# Patient Record
Sex: Female | Born: 1995 | Race: Black or African American | Hispanic: No | Marital: Single | Smoking: Never smoker
Health system: Southern US, Community
[De-identification: ages and names within clinical notes are randomized; demographics above are authoritative.]

## PROBLEM LIST (undated history)

## (undated) ENCOUNTER — Inpatient Hospital Stay (HOSPITAL_COMMUNITY): Payer: Self-pay

## (undated) ENCOUNTER — Emergency Department (HOSPITAL_COMMUNITY)

## (undated) DIAGNOSIS — R51 Headache: Secondary | ICD-10-CM

## (undated) DIAGNOSIS — R519 Headache, unspecified: Secondary | ICD-10-CM

## (undated) HISTORY — DX: Headache: R51

## (undated) HISTORY — PX: WISDOM TOOTH EXTRACTION: SHX21

## (undated) HISTORY — DX: Headache, unspecified: R51.9

---
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
## 2017-09-29 ENCOUNTER — Ambulatory Visit: Payer: BC Managed Care – PPO | Admitting: Family Medicine

## 2017-09-29 DIAGNOSIS — Z0289 Encounter for other administrative examinations: Secondary | ICD-10-CM

## 2017-11-25 ENCOUNTER — Encounter: Payer: Self-pay | Admitting: Family Medicine

## 2017-11-25 ENCOUNTER — Ambulatory Visit (INDEPENDENT_AMBULATORY_CARE_PROVIDER_SITE_OTHER): Payer: BC Managed Care – PPO | Admitting: Family Medicine

## 2017-11-25 VITALS — BP 100/70 | HR 80 | Temp 98.2°F | Ht 65.0 in | Wt 113.7 lb

## 2017-11-25 DIAGNOSIS — Z7689 Persons encountering health services in other specified circumstances: Secondary | ICD-10-CM

## 2017-11-25 DIAGNOSIS — Z23 Encounter for immunization: Secondary | ICD-10-CM | POA: Diagnosis not present

## 2017-11-25 DIAGNOSIS — G4700 Insomnia, unspecified: Secondary | ICD-10-CM | POA: Diagnosis not present

## 2017-11-25 DIAGNOSIS — F32A Depression, unspecified: Secondary | ICD-10-CM

## 2017-11-25 DIAGNOSIS — R634 Abnormal weight loss: Secondary | ICD-10-CM | POA: Diagnosis not present

## 2017-11-25 DIAGNOSIS — K219 Gastro-esophageal reflux disease without esophagitis: Secondary | ICD-10-CM | POA: Diagnosis not present

## 2017-11-25 DIAGNOSIS — F329 Major depressive disorder, single episode, unspecified: Secondary | ICD-10-CM

## 2017-11-25 LAB — CBC WITH DIFFERENTIAL/PLATELET
BASOS ABS: 0 10*3/uL (ref 0.0–0.1)
BASOS PCT: 0.3 % (ref 0.0–3.0)
EOS ABS: 0.1 10*3/uL (ref 0.0–0.7)
Eosinophils Relative: 2.2 % (ref 0.0–5.0)
HEMATOCRIT: 38 % (ref 36.0–49.0)
Hemoglobin: 12.6 g/dL (ref 12.0–16.0)
LYMPHS PCT: 26.5 % (ref 24.0–48.0)
Lymphs Abs: 1.8 10*3/uL (ref 0.7–4.0)
MCHC: 33.1 g/dL (ref 31.0–37.0)
MCV: 94.2 fl (ref 78.0–98.0)
MONO ABS: 0.5 10*3/uL (ref 0.1–1.0)
Monocytes Relative: 7.7 % (ref 3.0–12.0)
NEUTROS ABS: 4.3 10*3/uL (ref 1.4–7.7)
Neutrophils Relative %: 63.3 % (ref 43.0–71.0)
PLATELETS: 360 10*3/uL (ref 150.0–575.0)
RBC: 4.03 Mil/uL (ref 3.80–5.70)
RDW: 15.9 % — AB (ref 11.4–15.5)
WBC: 6.8 10*3/uL (ref 4.5–13.5)

## 2017-11-25 LAB — COMPREHENSIVE METABOLIC PANEL
ALK PHOS: 39 U/L — AB (ref 47–119)
ALT: 5 U/L (ref 0–35)
AST: 10 U/L (ref 0–37)
Albumin: 4.4 g/dL (ref 3.5–5.2)
BILIRUBIN TOTAL: 0.9 mg/dL (ref 0.2–1.2)
BUN: 9 mg/dL (ref 6–23)
CALCIUM: 9.1 mg/dL (ref 8.4–10.5)
CO2: 28 meq/L (ref 19–32)
CREATININE: 0.92 mg/dL (ref 0.40–1.20)
Chloride: 105 mEq/L (ref 96–112)
GFR: 100.42 mL/min (ref >60.00)
GLUCOSE: 85 mg/dL (ref 70–99)
Potassium: 3.7 mEq/L (ref 3.5–5.1)
Sodium: 141 mEq/L (ref 135–145)
TOTAL PROTEIN: 6.6 g/dL (ref 6.0–8.3)

## 2017-11-25 LAB — TSH: TSH: 3.6 u[IU]/mL (ref 0.40–5.00)

## 2017-11-25 LAB — LIPASE: Lipase: 12 U/L (ref 11.0–59.0)

## 2017-11-25 LAB — T4, FREE: Free T4: 0.85 ng/dL (ref 0.60–1.60)

## 2017-11-25 MED ORDER — OMEPRAZOLE 20 MG PO CPDR: 20.0000 mg | DELAYED_RELEASE_CAPSULE | Freq: Every day | ORAL | 3 refills | Status: DC

## 2017-11-25 NOTE — Patient Instructions (Addendum)
Gastroesophageal Reflux Disease, Adult Normally, food travels down the esophagus and stays in the stomach to be digested. If a person has gastroesophageal reflux disease (GERD), food and stomach acid move back up into the esophagus. When this happens, the esophagus becomes sore and swollen (inflamed). Over time, GERD can make small holes (ulcers) in the lining of the esophagus. Follow these instructions at home: Diet  Follow a diet as told by your doctor. You may need to avoid foods and drinks such as: ? Coffee and tea (with or without caffeine). ? Drinks that contain alcohol. ? Energy drinks and sports drinks. ? Carbonated drinks or sodas. ? Chocolate and cocoa. ? Peppermint and mint flavorings. ? Garlic and onions. ? Horseradish. ? Spicy and acidic foods, such as peppers, chili powder, curry powder, vinegar, hot sauces, and BBQ sauce. ? Citrus fruit juices and citrus fruits, such as oranges, lemons, and limes. ? Tomato-based foods, such as red sauce, chili, salsa, and pizza with red sauce. ? Fried and fatty foods, such as donuts, french fries, potato chips, and high-fat dressings. ? High-fat meats, such as hot dogs, rib eye steak, sausage, ham, and bacon. ? High-fat dairy items, such as whole milk, butter, and cream cheese.  Eat small meals often. Avoid eating large meals.  Avoid drinking large amounts of liquid with your meals.  Avoid eating meals during the 2-3 hours before bedtime.  Avoid lying down right after you eat.  Do not exercise right after you eat. General instructions  Pay attention to any changes in your symptoms.  Take over-the-counter and prescription medicines only as told by your doctor. Do not take aspirin, ibuprofen, or other NSAIDs unless your doctor says it is okay.  Do not use any tobacco products, including cigarettes, chewing tobacco, and e-cigarettes. If you need help quitting, ask your doctor.  Wear loose clothes. Do not wear anything tight around  your waist.  Raise (elevate) the head of your bed about 6 inches (15 cm).  Try to lower your stress. If you need help doing this, ask your doctor.  If you are overweight, lose an amount of weight that is healthy for you. Ask your doctor about a safe weight loss goal.  Keep all follow-up visits as told by your doctor. This is important. Contact a doctor if:  You have new symptoms.  You lose weight and you do not know why it is happening.  You have trouble swallowing, or it hurts to swallow.  You have wheezing or a cough that keeps happening.  Your symptoms do not get better with treatment.  You have a hoarse voice. Get help right away if:  You have pain in your arms, neck, jaw, teeth, or back.  You feel sweaty, dizzy, or light-headed.  You have chest pain or shortness of breath.  You throw up (vomit) and your throw up looks like blood or coffee grounds.  You pass out (faint).  Your poop (stool) is bloody or black.  You cannot swallow, drink, or eat. This information is not intended to replace advice given to you by your health care provider. Make sure you discuss any questions you have with your health care provider. Document Released: 04/26/2008 Document Revised: 04/15/2016 Document Reviewed: 03/05/2015 Elsevier Interactive Patient Education  2018 [employer]. Insomnia Insomnia is a sleep disorder that makes it difficult to fall asleep or to stay asleep. Insomnia can cause tiredness (fatigue), low energy, difficulty concentrating, mood swings, and poor performance at work or school. There are  three different ways to classify insomnia:  Difficulty falling asleep.  Difficulty staying asleep.  Waking up too early in the morning.  Any type of insomnia can be long-term (chronic) or short-term (acute). Both are common. Short-term insomnia usually lasts for three months or less. Chronic insomnia occurs at least three times a week for longer than three months. What are  the causes? Insomnia may be caused by another condition, situation, or substance, such as:  Anxiety.  Certain medicines.  Gastroesophageal reflux disease (GERD) or other gastrointestinal conditions.  Asthma or other breathing conditions.  Restless legs syndrome, sleep apnea, or other sleep disorders.  Chronic pain.  Menopause. This may include hot flashes.  Stroke.  Abuse of alcohol, tobacco, or illegal drugs.  Depression.  Caffeine.  Neurological disorders, such as Alzheimer disease.  An overactive thyroid (hyperthyroidism).  The cause of insomnia may not be known. What increases the risk? Risk factors for insomnia include:  Gender. Women are more commonly affected than men.  Age. Insomnia is more common as you get older.  Stress. This may involve your professional or personal life.  Income. Insomnia is more common in people with lower income.  Lack of exercise.  Irregular work schedule or night shifts.  Traveling between different time zones.  What are the signs or symptoms? If you have insomnia, trouble falling asleep or trouble staying asleep is the main symptom. This may lead to other symptoms, such as:  Feeling fatigued.  Feeling nervous about going to sleep.  Not feeling rested in the morning.  Having trouble concentrating.  Feeling irritable, anxious, or depressed.  How is this treated? Treatment for insomnia depends on the cause. If your insomnia is caused by an underlying condition, treatment will focus on addressing the condition. Treatment may also include:  Medicines to help you sleep.  Counseling or therapy.  Lifestyle adjustments.  Follow these instructions at home:  Take medicines only as directed by your health care provider.  Keep regular sleeping and waking hours. Avoid naps.  Keep a sleep diary to help you and your health care provider figure out what could be causing your insomnia. Include: ? When you sleep. ? When you  wake up during the night. ? How well you sleep. ? How rested you feel the next day. ? Any side effects of medicines you are taking. ? What you eat and drink.  Make your bedroom a comfortable place where it is easy to fall asleep: ? Put up shades or special blackout curtains to block light from outside. ? Use a white noise machine to block noise. ? Keep the temperature cool.  Exercise regularly as directed by your health care provider. Avoid exercising right before bedtime.  Use relaxation techniques to manage stress. Ask your health care provider to suggest some techniques that may work well for you. These may include: ? Breathing exercises. ? Routines to release muscle tension. ? Visualizing peaceful scenes.  Cut back on alcohol, caffeinated beverages, and cigarettes, especially close to bedtime. These can disrupt your sleep.  Do not overeat or eat spicy foods right before bedtime. This can lead to digestive discomfort that can make it hard for you to sleep.  Limit screen use before bedtime. This includes: ? Watching TV. ? Using your smartphone, tablet, and computer.  Stick to a routine. This can help you fall asleep faster. Try to do a quiet activity, brush your teeth, and go to bed at the same time each night.  Get out of  bed if you are still awake after 15 minutes of trying to sleep. Keep the lights down, but try reading or doing a quiet activity. When you feel sleepy, go back to bed.  Make sure that you drive carefully. Avoid driving if you feel very sleepy.  Keep all follow-up appointments as directed by your health care provider. This is important. Contact a health care provider if:  You are tired throughout the day or have trouble in your daily routine due to sleepiness.  You continue to have sleep problems or your sleep problems get worse. Get help right away if:  You have serious thoughts about hurting yourself or someone else. This information is not intended to  replace advice given to you by your health care provider. Make sure you discuss any questions you have with your health care provider. Document Released: 11/05/2000 Document Revised: 04/09/2016 Document Reviewed: 08/09/2014 Elsevier Interactive Patient Education  2018 [place] 18-39 Years, Female Preventive care refers to lifestyle choices and visits with your health care provider that can promote health and wellness. What does preventive care include?  A yearly physical exam. This is also called an annual well check.  Dental exams once or twice a year.  Routine eye exams. Ask your health care provider how often you should have your eyes checked.  Personal lifestyle choices, including: ? Daily care of your teeth and gums. ? Regular physical activity. ? Eating a healthy diet. ? Avoiding tobacco and drug use. ? Limiting alcohol use. ? Practicing safe sex. ? Taking vitamin and mineral supplements as recommended by your health care provider. What happens during an annual well check? The services and screenings done by your health care provider during your annual well check will depend on your age, overall health, lifestyle risk factors, and family history of disease. Counseling Your health care provider may ask you questions about your:  Alcohol use.  Tobacco use.  Drug use.  Emotional well-being.  Home and relationship well-being.  Sexual activity.  Eating habits.  Work and work Statistician.  Method of birth control.  Menstrual cycle.  Pregnancy history.  Screening You may have the following tests or measurements:  Height, weight, and BMI.  Diabetes screening. This is done by checking your blood sugar (glucose) after you have not eaten for a while (fasting).  Blood pressure.  Lipid and cholesterol levels. These may be checked every 5 years starting at age 25.  Skin check.  Hepatitis C blood test.  Hepatitis B blood test.  Sexually  transmitted disease (STD) testing.  BRCA-related cancer screening. This may be done if you have a family history of breast, ovarian, tubal, or peritoneal cancers.  Pelvic exam and Pap test. This may be done every 3 years starting at age 32. Starting at age 40, this may be done every 5 years if you have a Pap test in combination with an HPV test.  Discuss your test results, treatment options, and if necessary, the need for more tests with your health care provider. Vaccines Your health care provider may recommend certain vaccines, such as:  Influenza vaccine. This is recommended every year.  Tetanus, diphtheria, and acellular pertussis (Tdap, Td) vaccine. You may need a Td booster every 10 years.  Varicella vaccine. You may need this if you have not been vaccinated.  HPV vaccine. If you are 12 or younger, you may need three doses over 6 months.  Measles, mumps, and rubella (MMR) vaccine. You may need at least  one dose of MMR. You may also need a second dose.  Pneumococcal 13-valent conjugate (PCV13) vaccine. You may need this if you have certain conditions and were not previously vaccinated.  Pneumococcal polysaccharide (PPSV23) vaccine. You may need one or two doses if you smoke cigarettes or if you have certain conditions.  Meningococcal vaccine. One dose is recommended if you are age 72-21 years and a first-year college student living in a residence hall, or if you have one of several medical conditions. You may also need additional booster doses.  Hepatitis A vaccine. You may need this if you have certain conditions or if you travel or work in places where you may be exposed to hepatitis A.  Hepatitis B vaccine. You may need this if you have certain conditions or if you travel or work in places where you may be exposed to hepatitis B.  Haemophilus influenzae type b (Hib) vaccine. You may need this if you have certain risk factors.  Talk to your health care provider about which  screenings and vaccines you need and how often you need them. This information is not intended to replace advice given to you by your health care provider. Make sure you discuss any questions you have with your health care provider. Document Released: 01/04/2002 Document Revised: 07/28/2016 Document Reviewed: 09/09/2015 Elsevier Interactive Patient Education  [employer].

## 2017-11-25 NOTE — Progress Notes (Signed)
Patient presents to clinic today to establish care.  SUBJECTIVE: PMH: Pt is a 22 yo with pmh sig for migraines.  [facility] by Dr. Tama Highwiselton.  Insomnia: -Ongoing for the last 2-3 months -Patient states she may get 4 hours of sleep per night -Does endorse going to bed late and having to wake up early. -Patient has not tried anything for this  Weight loss: -Patient endorses losing weight without trying. -Pt has noticed this more after she stopped playing basketball -Patient does endorse decreased appetite.  She states after 1 bite of food she may not want to eating more or after warming up food she has no appetite. -Pt states her decreased appetite comes and goes.  Has been going on for "a while". -Patient endorses h/o GERD/reflux.   -Reflux worse near menses.  Will take Pepto bismal.  Allergies: NKDA  Past surgical history: Wisdom teeth extraction  Social history: Patient is single.  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  Patient denies tobacco use.  Patient endorses social alcohol use and daily marijuana use.  LMP November 18, 2017.  Family medical history: Mom-alive Dad-alive, asthma Sister-Shaniece, alive Brother-alive Brother-Jaden, asthma  Health Maintenance: [facility] [employer] [place] Immunizations --patient sent influenza vaccine today PAP --n/a      Past Medical History:  Diagnosis Date  . Headache     Past Surgical History:  Procedure Laterality Date  . WISDOM TOOTH EXTRACTION      No current outpatient medications on file prior to visit.   No current facility-administered medications on file prior to visit.     No Known Allergies  Family History  Problem Relation Age of Onset  . High blood pressure Maternal Grandmother        Died at 8453  . Alzheimer's disease Maternal Grandfather        Died at 8483  .  Cancer Paternal Grandfather        Age at time of death unknown    Social History   Socioeconomic History  . Marital status: Single    Spouse name: Not on file  . Number of children: Not on file  . Years of education: Not on file  . Highest education level: Not on file  Social Needs  . Financial resource strain: Not on file  . Food insecurity - worry: Not on file  . Food insecurity - inability: Not on file  . Transportation needs - medical: Not on file  . Transportation needs - non-medical: Not on file  Occupational History  . Not on file  Tobacco Use  . Smoking status: Never Smoker  . Smokeless tobacco: Never Used  Substance and Sexual Activity  . Alcohol use: No    Alcohol/week: 0.0 oz  . Drug use: Yes    Types: Marijuana  . Sexual activity: No  Other Topics Concern  . Not on file  Social History Narrative  . Not on file    ROS General: Denies fever, chills, night sweats, changes in weight, changes in appetite  + insomnia, decreased appetite, weight loss HEENT: Denies headaches, ear pain, changes in vision, rhinorrhea, sore throat CV: Denies CP, palpitations, SOB, orthopnea Pulm: Denies SOB, cough, wheezing GI: Denies abdominal pain, nausea, vomiting, diarrhea, constipation    + reflux GU: Denies dysuria, hematuria, frequency, vaginal discharge Msk: Denies muscle cramps, joint pains Neuro: Denies weakness, numbness, tingling Skin: Denies rashes,  bruising Psych: Denies anxiety, hallucinations   + depression  BP 100/70 (BP Location: Right Arm, Patient Position: Sitting, Cuff Size: Normal)   Pulse 80   Temp 98.2 F (36.8 C) (Oral)   Ht 5\' 5"  (1.651 m)   Wt 113 lb 11.2 oz (51.6 kg)   BMI 18.92 kg/m   Physical Exam Gen. Pleasant, well developed, well-nourished, in NAD HEENT - [place]/AT, PERRL, EOMI, conjunctive clear, no scleral icterus, no nasal drainage, pharynx without erythema or exudate. Neck: No JVD, no thyromegaly Lungs: no use of accessory muscles, CTAB,  no wheezes, rales or rhonchi Cardiovascular: RRR, No r/g/m, no peripheral edema Abdomen: BS present, soft, nontender,nondistended, no hepatosplenomegaly Musculoskeletal: No deformities, moves all four extremities, no cyanosis or clubbing, normal tone Neuro:  A&Ox3, CN II-XII intact, normal gait Skin:  Warm, dry, intact, no lesions Psych: normal affect, mood appropriate  No results found for this or any previous visit (from the past 2160 hour(s)).  Assessment/Plan: Gastroesophageal reflux disease, esophagitis presence not specified  - Plan: omeprazole (PRILOSEC) 20 MG capsule, Comprehensive metabolic panel, Lipase  Need for immunization against influenza  - Plan: Flu Vaccine QUAD 36+ mos IM  Insomnia, unspecified type  -Discussed sleep hygiene -Discussed limiting alcohol intake and marijuana use - Plan: TSH, T4, free  Weight loss  -Discussed limiting alcohol intake and marijuana use - Plan: TSH, T4, free, Comprehensive metabolic panel, Lipase, CBC with Differential/Platelet, CBC with Differential/Platelet  Depression, unspecified depression type -PHQ 9 score 14, moderate -Mention counseling.  Patient contemplating this -Patient does not wish to try medication at this time. -We will have patient follow-up in 1 month to reevaluate.  Sooner if needed  Encounter to establish care -We reviewed the PMH, PSH, FH, SH, Meds and Allergies. -We provided refills for any medications we will prescribe as needed. -We addressed current concerns per orders and patient instructions. -We have asked for records for pertinent exams, studies, vaccines and notes from previous providers. -We have advised patient to follow up per instructions below.    Abbe Amsterdam, MD

## 2018-01-13 ENCOUNTER — Ambulatory Visit: Payer: BC Managed Care – PPO | Admitting: Family Medicine

## 2018-01-13 ENCOUNTER — Encounter: Payer: Self-pay | Admitting: Family Medicine

## 2018-01-13 VITALS — BP 110/76 | HR 64 | Temp 98.3°F | Wt 109.0 lb

## 2018-01-13 DIAGNOSIS — R634 Abnormal weight loss: Secondary | ICD-10-CM | POA: Diagnosis not present

## 2018-01-13 DIAGNOSIS — F458 Other somatoform disorders: Secondary | ICD-10-CM

## 2018-01-13 DIAGNOSIS — R0989 Other specified symptoms and signs involving the circulatory and respiratory systems: Secondary | ICD-10-CM

## 2018-01-13 MED ORDER — OMEPRAZOLE 20 MG PO CPDR: 20.0000 mg | DELAYED_RELEASE_CAPSULE | Freq: Two times a day (BID) | ORAL | 3 refills | Status: DC

## 2018-01-13 NOTE — Patient Instructions (Addendum)
Globus Pharyngeus Globus pharyngeus is a condition that makes it feel like you have a lump in your throat. It may also feel like you have something stuck in the front of your throat. This feeling may come and go. It is not painful, and it does not make it harder to swallow food or liquid. Globus pharyngeus does not cause changes that a health care provider can see during a physical exam. This condition usually goes away without treatment. What are the causes? Often, no cause can be found. The most common cause of globus pharyngeus is a condition that causes stomach juices to flow back up into the throat (gastroesophageal reflux). Other possible causes include:  Overstimulation of nerves that control swallowing.  Irritation of nerves that control swallowing (neuralgia).  An enlarged gland in the lower neck (thyroid gland).  Growth of tonsil tissue at the base of the tongue (lingual tonsil).  Anxiety.  Depression.  What are the signs or symptoms? The main symptom of this condition is a feeling of a lump in your throat. This feeling usually comes and goes. How is this diagnosed? This condition may be diagnosed after other conditions have been ruled out. You may have tests, such as:  A swallow study.  Ear, nose, and throat evaluation.  An exam of your throat using a thin, flexible tube with a light and camera on the end (endoscopy).  How is this treated? This condition may go away on its own, without treatment. In some cases, antidepressant medicines may be helpful. Follow these instructions at home:  Follow instructions from your health care provider about eating or drinking restrictions.  Take over-the-counter and prescription medicines only as told by your health care provider.  Keep all follow-up visits as told by your health care provider. This is important.  Follow instructions from your health care provider about home care for gastroesophageal reflux. Your health care  provider may recommend that you: ? Do not eat or drink anything that causes heartburn. ? Do not eat heavy meals close to bedtime. ? Do not drink caffeine. ? Do not drink alcohol. ? Raise the head of your bed. ? Sleep on your left side. Contact a health care provider if:  Your symptoms get worse.  You have throat pain.  You have trouble swallowing.  Food or liquid comes back up into your mouth.  You lose weight without trying. Get help right away if:  You develop swelling in your throat. Summary  Globus pharyngeus is a condition that makes it feel like you have a lump in your throat.  This condition usually goes away without treatment. This information is not intended to replace advice given to you by your health care provider. Make sure you discuss any questions you have with your health care provider. Document Released: 07/14/2016 Document Revised: 07/14/2016 Document Reviewed: 07/14/2016 Elsevier Interactive Patient Education  2018 Elsevier Inc.  

## 2018-01-13 NOTE — Progress Notes (Signed)
Subjective:    Patient ID: Elaine Montgomery, female    DOB: 04/09/1996, 22 y.o.   MRN: [id number]  No chief complaint on file. Patient is accompanied by her mother.  HPI Patient was seen today for follow-up on ongoing concern.  Pt continues to endorse difficulty eating foods, hot flashes, weight loss.  Pt states she feels like food is getting stuck in her throat.  Pt continues to have decreased appetite.  She may eat 1 bite of food and then not be able to eat anything else.  Pt unable to describe fully the feeling, states not really sure if she is having pain or if the feeling is just 2/2 being hungry.  Pt states she may wake up from sleep feeling like food is stuck in her.  Pt also endorses approximately 30 pound weight loss over the last 6 months.  Since last OFV on 11/25/17 pt has lost 4lbs.  Pt has some improvement when taking omeprazole 20 mg.  Pt also endorses loose stools.  Labs ordered at last OFV were normal.  They included CMP, CBC, TSH, free T4.  Past Medical History:  Diagnosis Date  . Headache     No Known Allergies  ROS General: Denies fever, chills, night sweats +changes in weight, changes in appetite   HEENT: Denies headaches, ear pain, changes in vision, rhinorrhea, sore throat CV: Denies CP, palpitations, SOB, orthopnea Pulm: Denies SOB, cough, wheezing GI: Denies abdominal pain, nausea, vomiting, diarrhea, constipation   +globus pharyngeus  GU: Denies dysuria, hematuria, frequency, vaginal discharge Msk: Denies muscle cramps, joint pains Neuro: Denies weakness, numbness, tingling Skin: Denies rashes, bruising Psych: Denies depression, anxiety, hallucinations     Objective:    Blood pressure 110/76, pulse 64, temperature 98.3 F (36.8 C), temperature source Oral, weight 109 lb (49.4 kg), last menstrual period 01/13/2018, SpO2 98 %.   Gen. Pleasant, well-nourished, in no distress, normal affect   HEENT: [place]/AT, face symmetric, no scleral icterus, PERRLA, nares  patent without drainage, pharynx without erythema or exudate Lungs: no accessory muscle use, CTAB, no wheezes or rales Cardiovascular: RRR, no m/r/g, no peripheral edema Abdomen: BS present, soft, ND, mild TTP of LLQ, no hepatosplenomegaly. Neuro:  A&Ox3, CN II-XII intact, normal gait    Wt Readings from Last 3 Encounters:  01/13/18 109 lb (49.4 kg) (13 %, Z= -1.10)*  11/25/17 113 lb 11.2 oz (51.6 kg) (22 %, Z= -0.78)*  11/12/14 123 lb (55.8 kg) (55 %, Z= 0.11)*   * Growth percentiles are based on CDC (Girls, 2-20 Years) data.    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  Consider lipase as well. -given handout -Will refer to GI, will likely need EGD.  GI appt Monday 25th at 2:15 pm [facility] - Plan: omeprazole (PRILOSEC) 20 MG capsule, Ambulatory referral to Gastroenterology  Weight loss  -likely 2/2 decreased appetite/inability to eat. - Plan: Ambulatory referral to Gastroenterology  F/u prn   Abbe AmsterdamShannon Aleighna Wojtas, MD.

## 2018-01-16 ENCOUNTER — Encounter: Payer: Self-pay | Admitting: Physician Assistant

## 2018-01-16 ENCOUNTER — Ambulatory Visit: Payer: BC Managed Care – PPO | Admitting: Physician Assistant

## 2018-01-16 VITALS — BP 112/58 | HR 68 | Ht 64.75 in | Wt 107.6 lb

## 2018-01-16 DIAGNOSIS — R110 Nausea: Secondary | ICD-10-CM

## 2018-01-16 DIAGNOSIS — F458 Other somatoform disorders: Secondary | ICD-10-CM

## 2018-01-16 DIAGNOSIS — R0989 Other specified symptoms and signs involving the circulatory and respiratory systems: Secondary | ICD-10-CM

## 2018-01-16 DIAGNOSIS — R1013 Epigastric pain: Secondary | ICD-10-CM

## 2018-01-16 DIAGNOSIS — R634 Abnormal weight loss: Secondary | ICD-10-CM | POA: Diagnosis not present

## 2018-01-16 NOTE — Patient Instructions (Addendum)
You have been scheduled for an endoscopy. Please follow written instructions given to you at your visit today. If you use inhalers (even only as needed), please bring them with you on the day of your procedure. Your physician has requested that you go to www.startemmi.com and enter the access code given to you at your visit today. This web site gives a general overview about your procedure. However, you should still follow specific instructions given to you by our office regarding your preparation for the procedure.  Continue Omeprazole 20 mg twice a day

## 2018-01-16 NOTE — Progress Notes (Signed)
Assessment and plans reviewed  

## 2018-01-16 NOTE — Progress Notes (Signed)
Chief Complaint: Weight Loss  HPI:    [name] is a 22 year old African-American female, who was referred to me by Deeann Saint, MD for a complaint of weight loss.      Per review of chart patient has lost 6 pounds per our records since January of this year, from 113-107.  Last documented weight before this was 123 and 2015.    Patient saw PCP 01/13/18 and endorsed difficulty with eating foods as well as hot flashes and weight loss.  She felt as though food was getting stuck in her throat and continued with a decreased appetite.  She reported 30 pound weight loss over the past 6 months.  Apparently some improvement with omeprazole 20 mg daily.  Omeprazole was increased to 20 mg twice daily.  Recent labs 11/25/17 CBC, lipase CMP and TSH were normal.    Today, accompanied by mom who does assist with history.  About 6 months ago she quit playing basketball and since then she has lost 30 pounds per her scales at home. Has a feeling of nausea and food aversion with no vomiting.  Also with some epigastric/left upper quadrant abdominal pain and a softer stool than normal, decrease in appetite and early satiety.  Omeprazole 20 mg once daily did help a little and this was increased to twice daily last week and the patient was able to eat a full meal yesterday.  Explains that she gets an appetite late at night.  Does admit to daily marijuana use.  Hot showers do not help with nausea.    Denies fever, chills, blood in her school, melena or symptoms that awaken her at night.  Past Medical History:  Diagnosis Date  . Headache     Past Surgical History:  Procedure Laterality Date  . WISDOM TOOTH EXTRACTION      Current Outpatient Medications  Medication Sig Dispense Refill  . naproxen (NAPROSYN) 500 MG tablet Take 500 mg by mouth 2 (two) times daily with a meal.    . omeprazole (PRILOSEC) 20 MG capsule Take 1 capsule (20 mg total) by mouth 2 (two) times daily before a meal. 60 capsule 3   No current  facility-administered medications for this visit.     Allergies as of 01/16/2018  . (No Known Allergies)    Family History  Problem Relation Age of Onset  . High blood pressure Maternal Grandmother        Died at 85  . Alzheimer's disease Maternal Grandfather        Died at 10  . Cancer Paternal Grandfather        Age at time of death unknown    Social History   Socioeconomic History  . Marital status: Single    Spouse name: Not on file  . Number of children: Not on file  . Years of education: Not on file  . Highest education level: Not on file  Social Needs  . Financial resource strain: Not on file  . Food insecurity - worry: Not on file  . Food insecurity - inability: Not on file  . Transportation needs - medical: Not on file  . Transportation needs - non-medical: Not on file  Occupational History  . Not on file  Tobacco Use  . Smoking status: Never Smoker  . Smokeless tobacco: Never Used  Substance and Sexual Activity  . Alcohol use: No    Alcohol/week: 0.0 oz  . Drug use: Yes    Types: Marijuana  .  Sexual activity: No  Other Topics Concern  . Not on file  Social History Narrative  . Not on file    Review of Systems:    Constitutional: No fever or chills Skin: No rash  Cardiovascular: No chest pain Respiratory: No SOB  Gastrointestinal: See HPI and otherwise negative Genitourinary: No dysuria Neurological: No headache, dizziness or syncope Musculoskeletal: No new muscle or joint pain Hematologic: No bleeding Psychiatric: No history of depression or anxiety   Physical Exam:  Vital signs: Ht 5' 4.75" (1.645 m)   Wt 107 lb 9.6 oz (48.8 kg)   LMP 01/13/2018 (Exact Date)   BMI 18.04 kg/m   Constitutional:   Pleasant thin appearing AA female appears to be in NAD, Well developed, Well nourished, alert and cooperative Head:  Normocephalic and atraumatic. Eyes:   PEERL, EOMI. No icterus. Conjunctiva pink. Ears:  Normal auditory acuity. Neck:   Supple Throat: Oral cavity and pharynx without inflammation, swelling or lesion.  Respiratory: Respirations even and unlabored. Lungs clear to auscultation bilaterally.   No wheezes, crackles, or rhonchi.  Cardiovascular: Normal S1, S2. No MRG. Regular rate and rhythm. No peripheral edema, cyanosis or pallor.  Gastrointestinal:  Soft, nondistended, mild LUQ ttp, No rebound or guarding. Normal bowel sounds. No appreciable masses or hepatomegaly. Rectal:  Not performed.  Msk:  Symmetrical without gross deformities. Without edema, no deformity or joint abnormality.  Neurologic:  Alert and  oriented x4;  grossly normal neurologically.  Skin:   Dry and intact without significant lesions or rashes. Psychiatric:  Demonstrates good judgement and reason without abnormal affect or behaviors.  RELEVANT LABS AND IMAGING: CBC    Component Value Date/Time   WBC 6.8 11/25/2017 0918   RBC 4.03 11/25/2017 0918   HGB 12.6 11/25/2017 0918   HCT 38.0 11/25/2017 0918   PLT 360.0 11/25/2017 0918   MCV 94.2 11/25/2017 0918   MCHC 33.1 11/25/2017 0918   RDW 15.9 (H) 11/25/2017 0918   LYMPHSABS 1.8 11/25/2017 0918   MONOABS 0.5 11/25/2017 0918   EOSABS 0.1 11/25/2017 0918   BASOSABS 0.0 11/25/2017 0918    CMP     Component Value Date/Time   NA 141 11/25/2017 0918   K 3.7 11/25/2017 0918   CL 105 11/25/2017 0918   CO2 28 11/25/2017 0918   GLUCOSE 85 11/25/2017 0918   BUN 9 11/25/2017 0918   CREATININE 0.92 11/25/2017 0918   CALCIUM 9.1 11/25/2017 0918   PROT 6.6 11/25/2017 0918   ALBUMIN 4.4 11/25/2017 0918   AST 10 11/25/2017 0918   ALT 5 11/25/2017 0918   ALKPHOS 39 (L) 11/25/2017 0918   BILITOT 0.9 11/25/2017 0918    Assessment: 1. Globus sensation: Occurs in the morning after having something to eat late at night; consider esophagitis versus GERD 2. Decreased appetite: Consider relation to nausea and early satiety; must consider relation to marijuana use 3. Weight loss: 30 pounds per  patient report in the past 6 months; consider relation to nausea above vs celiac disease vs h.pylori vs IBS/functional dyspepsia 4.  Abdominal pain  Plan: 1.  Scheduled patient for an EGD with [facility]Perry [facility].  Did discuss risk, benefits, limitations and alternatives and the patient agrees to proceed. 2.  Continue Omeprazole 20 mg twice daily, 30-60 minutes before breakfast and dinner. 3.  Recommend the patient discontinue her marijuana use. 4.  Recommend supplementing diet with boost/Ensure shakes 5.  Patient to await further recommendations after EGD.  Hyacinth MeekerJennifer Fontella Shan, PA-C [facility] 01/16/2018, 2:09 PM  Cc: Deeann Saint, MD

## 2018-01-17 ENCOUNTER — Ambulatory Visit (AMBULATORY_SURGERY_CENTER): Payer: BC Managed Care – PPO | Admitting: Internal Medicine

## 2018-01-17 ENCOUNTER — Encounter: Payer: Self-pay | Admitting: Internal Medicine

## 2018-01-17 ENCOUNTER — Other Ambulatory Visit: Payer: Self-pay

## 2018-01-17 VITALS — BP 128/86 | HR 54 | Temp 98.6°F | Resp 13 | Ht 64.0 in | Wt 107.0 lb

## 2018-01-17 DIAGNOSIS — R1013 Epigastric pain: Secondary | ICD-10-CM | POA: Diagnosis not present

## 2018-01-17 DIAGNOSIS — R634 Abnormal weight loss: Secondary | ICD-10-CM

## 2018-01-17 DIAGNOSIS — R110 Nausea: Secondary | ICD-10-CM

## 2018-01-17 MED ORDER — SODIUM CHLORIDE 0.9 % IV SOLN: 500.0000 mL | Freq: Once | INTRAVENOUS | Status: DC

## 2018-01-17 NOTE — Progress Notes (Signed)
Spontaneous respirations throughout. VSS. Resting comfortably. To PACU on room air. Report to  RN. 

## 2018-01-17 NOTE — Progress Notes (Signed)
Pt states that there is no possibility that she could be pregnant. Declines pregnancy test at this time.

## 2018-01-17 NOTE — Op Note (Signed)
[facility] Patient Name: Elaine Montgomery Procedure Date: 01/17/2018 3:16 PM MRN: [id number] Endoscopist: Wilhemina Bonito. Marina Goodell , MD Age: 22 Referring MD:  Date of Birth: [birth year] Gender: Female Account #: [id number] Procedure:                Upper GI endoscopy Indications:              Anorexia, Abdominal bloating, Weight loss Medicines:                Monitored Anesthesia Care Procedure:                Pre-Anesthesia Assessment:                           - Prior to the procedure, a History and Physical                            was performed, and patient medications and                            allergies were reviewed. The patient's tolerance of                            previous anesthesia was also reviewed. The risks                            and benefits of the procedure and the sedation                            options and risks were discussed with the patient.                            All questions were answered, and informed consent                            was obtained. Prior Anticoagulants: The patient has                            taken no previous anticoagulant or antiplatelet                            agents. ASA Grade Assessment: I - A normal, healthy                            patient. After reviewing the risks and benefits,                            the patient was deemed in satisfactory condition to                            undergo the procedure.                           After obtaining informed consent, the endoscope was  passed under direct vision. Throughout the                            procedure, the patient's blood pressure, pulse, and                            oxygen saturations were monitored continuously. The                            Model GIF-HQ190 [id number]) scope was introduced                            through the mouth, and advanced to the second part                            of duodenum. The upper GI  endoscopy was                            accomplished without difficulty. The patient                            tolerated the procedure well. Scope In: Scope Out: Findings:                 The esophagus was normal.                           The stomach was normal.                           The examined duodenum was normal.                           The cardia and gastric fundus were normal on                            retroflexion. Complications:            No immediate complications. Estimated Blood Loss:     Estimated blood loss: none. Impression:               - Normal esophagus.                           - Normal stomach.                           - Normal examined duodenum.                           - No specimens collected. Recommendation:           - Patient has a contact number available for                            emergencies. The signs and symptoms of potential  delayed complications were discussed with the                            patient. Return to normal activities tomorrow.                            Written discharge instructions were provided to the                            patient.                           - Resume previous diet.                           - Continue present medications.                           - Schedule solid-phase gastric emptying scan "rule                            out gastroparesis". We will contact you with the                            results and further recommendations, if indicated,                            when available. Wilhemina BonitoJohn N. Marina GoodellPerry, MD 01/17/2018 3:29:13 PM This report has been signed electronically.

## 2018-01-17 NOTE — Patient Instructions (Signed)
DR. Lamar SprinklesPERRY'S OFFICE WILL SCHEDULE GASTRIC EMPTYING SCAN..  YOU HAD AN ENDOSCOPIC PROCEDURE TODAY AT THE [facility]:   Refer to the procedure report that was given to you for any specific questions about what was found during the examination.  If the procedure report does not answer your questions, please call your gastroenterologist to clarify.  If you requested that your care partner not be given the details of your procedure findings, then the procedure report has been included in a sealed envelope for you to review at your convenience later.  YOU SHOULD EXPECT: Some feelings of bloating in the abdomen. Passage of more gas than usual.  Walking can help get rid of the air that was put into your GI tract during the procedure and reduce the bloating. If you had a lower endoscopy (such as a colonoscopy or flexible sigmoidoscopy) you may notice spotting of blood in your stool or on the toilet paper. If you underwent a bowel prep for your procedure, you may not have a normal bowel movement for a few days.  Please Note:  You might notice some irritation and congestion in your nose or some drainage.  This is from the oxygen used during your procedure.  There is no need for concern and it should clear up in a day or so.  SYMPTOMS TO REPORT IMMEDIATELY:    Following upper endoscopy (EGD)  Vomiting of blood or coffee ground material  New chest pain or pain under the shoulder blades  Painful or persistently difficult swallowing  New shortness of breath  Fever of 100F or higher  Black, tarry-looking stools  For urgent or emergent issues, a gastroenterologist can be reached at any hour by calling [phone].   DIET:  We do recommend a small meal at first, but then you may proceed to your regular diet.  Drink plenty of fluids but you should avoid alcoholic beverages for 24 hours.  ACTIVITY:  You should plan to take it easy for the rest of today and you should NOT DRIVE or use heavy  machinery until tomorrow (because of the sedation medicines used during the test).    FOLLOW UP: Our staff will call the number listed on your records the next business day following your procedure to check on you and address any questions or concerns that you may have regarding the information given to you following your procedure. If we do not reach you, we will leave a message.  However, if you are feeling well and you are not experiencing any problems, there is no need to return our call.  We will assume that you have returned to your regular daily activities without incident.  If any biopsies were taken you will be contacted by phone or by letter within the next 1-3 weeks.  Please call us at [phone] if you have not heard about the biopsies in 3 weeks.    SIGNATURES/CONFIDENTIALITY: You and/or your care partner have signed paperwork which will be entered into your electronic medical record.  These signatures attest to the fact that that the information above on your After Visit Summary has been reviewed and is understood.  Full responsibility of the confidentiality of this discharge information lies with you and/or your care-partner.

## 2018-01-17 NOTE — Progress Notes (Signed)
Pt's states no medical or surgical changes since previsit or office visit. 

## 2018-01-18 ENCOUNTER — Telehealth: Payer: Self-pay

## 2018-01-18 ENCOUNTER — Other Ambulatory Visit: Payer: Self-pay

## 2018-01-18 DIAGNOSIS — R1084 Generalized abdominal pain: Secondary | ICD-10-CM

## 2018-01-18 NOTE — Telephone Encounter (Signed)
[facility]30am, pt to arrive there at 7:15am. Pt to be NPO after midnight and hold prilosec for 8 hours prior to exam. Pt knows this is a 4 hour test.

## 2018-01-18 NOTE — Telephone Encounter (Signed)
  Follow up Call-  Call Elaine Montgomery number 01/17/2018  Post procedure Call Elaine Montgomery phone  # [phone]  Permission to leave phone message Yes  Some recent data might be hidden     Patient questions:  Do you have a fever, pain , or abdominal swelling? No. Pain Score  0 *  Have you tolerated food without any problems? Yes.    Have you been able to return to your normal activities? Yes.    Do you have any questions about your discharge instructions: Diet   No. Medications  No. Follow up visit  No.  Do you have questions or concerns about your Care? No.  Actions: * If pain score is 4 or above: No action needed, pain <4.

## 2018-01-19 ENCOUNTER — Telehealth: Payer: Self-pay | Admitting: Internal Medicine

## 2018-01-19 NOTE — Telephone Encounter (Signed)
Letter faxed per pt request.

## 2018-01-31 ENCOUNTER — Encounter (HOSPITAL_COMMUNITY)
Admission: RE | Admit: 2018-01-31 | Discharge: 2018-01-31 | Disposition: A | Discharge status: Home / Self Care | Payer: BC Managed Care – PPO | Source: Ambulatory Visit | Attending: Internal Medicine | Admitting: Internal Medicine

## 2018-01-31 DIAGNOSIS — R1084 Generalized abdominal pain: Secondary | ICD-10-CM | POA: Diagnosis not present

## 2018-01-31 MED ORDER — TECHNETIUM TC 99M SULFUR COLLOID
1.9000 | Freq: Once | INTRAVENOUS | Status: AC | PRN
  Administered 2018-01-31: 1.9 via INTRAVENOUS

## 2018-03-14 ENCOUNTER — Ambulatory Visit: Payer: BC Managed Care – PPO | Admitting: Physician Assistant

## 2018-04-28 ENCOUNTER — Ambulatory Visit: Payer: BC Managed Care – PPO | Admitting: Family Medicine

## 2018-04-28 ENCOUNTER — Encounter: Payer: Self-pay | Admitting: Family Medicine

## 2018-04-28 VITALS — BP 100/62 | HR 84 | Temp 98.3°F | Wt 113.0 lb

## 2018-04-28 DIAGNOSIS — H6121 Impacted cerumen, right ear: Secondary | ICD-10-CM | POA: Diagnosis not present

## 2018-04-28 NOTE — Patient Instructions (Signed)
Earwax Buildup, Adult The ears produce a substance called earwax that helps keep bacteria out of the ear and protects the skin in the ear canal. Occasionally, earwax can build up in the ear and cause discomfort or hearing loss. What increases the risk? This condition is more likely to develop in people who:  Are female.  Are elderly.  Naturally produce more earwax.  Clean their ears often with cotton swabs.  Use earplugs often.  Use in-ear headphones often.  Wear hearing aids.  Have narrow ear canals.  Have earwax that is overly thick or sticky.  Have eczema.  Are dehydrated.  Have excess hair in the ear canal.  What are the signs or symptoms? Symptoms of this condition include:  Reduced or muffled hearing.  A feeling of fullness in the ear or feeling that the ear is plugged.  Fluid coming from the ear.  Ear pain.  Ear itch.  Ringing in the ear.  Coughing.  An obvious piece of earwax that can be seen inside the ear canal.  How is this diagnosed? This condition may be diagnosed based on:  Your symptoms.  Your medical history.  An ear exam. During the exam, your health care provider will look into your ear with an instrument called an otoscope.  You may have tests, including a hearing test. How is this treated? This condition may be treated by:  Using ear drops to soften the earwax.  Having the earwax removed by a health care provider. The health care provider may: ? Flush the ear with water. ? Use an instrument that has a loop on the end (curette). ? Use a suction device.  Surgery to remove the wax buildup. This may be done in severe cases.  Follow these instructions at home:  Take over-the-counter and prescription medicines only as told by your health care provider.  Do not put any objects, including cotton swabs, into your ear. You can clean the opening of your ear canal with a washcloth or facial tissue.  Follow instructions from your health  care provider about cleaning your ears. Do not over-clean your ears.  Drink enough fluid to keep your urine clear or pale yellow. This will help to thin the earwax.  Keep all follow-up visits as told by your health care provider. If earwax builds up in your ears often or if you use hearing aids, consider seeing your health care provider for routine, preventive ear cleanings. Ask your health care provider how often you should schedule your cleanings.  If you have hearing aids, clean them according to instructions from the manufacturer and your health care provider. Contact a health care provider if:  You have ear pain.  You develop a fever.  You have blood, pus, or other fluid coming from your ear.  You have hearing loss.  You have ringing in your ears that does not go away.  Your symptoms do not improve with treatment.  You feel like the room is spinning (vertigo). Summary  Earwax can build up in the ear and cause discomfort or hearing loss.  The most common symptoms of this condition include reduced or muffled hearing and a feeling of fullness in the ear or feeling that the ear is plugged.  This condition may be diagnosed based on your symptoms, your medical history, and an ear exam.  This condition may be treated by using ear drops to soften the earwax or by having the earwax removed by a health care provider.  Do   not put any objects, including cotton swabs, into your ear. You can clean the opening of your ear canal with a washcloth or facial tissue. This information is not intended to replace advice given to you by your health care provider. Make sure you discuss any questions you have with your health care provider. Document Released: 12/16/2004 Document Revised: 01/19/2017 Document Reviewed: 01/19/2017 Elsevier Interactive Patient Education  2018 [employer]vier Inc[employer]  

## 2018-04-28 NOTE — Progress Notes (Signed)
Subjective:    Patient ID: Elaine Montgomery, female    DOB: 06/07/1996, 22 y.o.   MRN: [id number]  No chief complaint on file.   HPI Patient was seen today for acute concern.  Patient endorses feeling like her right ear is stopped up.  Patient thinks she cleaned both of ears but felt like she could not get the right ear cleaned.  Pt tried eardrops, qtips, and hydrogen peroxide to try to soften the wax.  Past Medical History:  Diagnosis Date  . Headache     No Known Allergies  ROS General: Denies fever, chills, night sweats, changes in weight, changes in appetite HEENT: Denies headaches, ear pain, changes in vision, rhinorrhea, sore throat  +decreased hearing in R ear/clogged R ear CV: Denies CP, palpitations, SOB, orthopnea Pulm: Denies SOB, cough, wheezing GI: Denies abdominal pain, nausea, vomiting, diarrhea, constipation GU: Denies dysuria, hematuria, frequency, vaginal discharge Msk: Denies muscle cramps, joint pains Neuro: Denies weakness, numbness, tingling Skin: Denies rashes, bruising Psych: Denies depression, anxiety, hallucinations     Objective:    Blood pressure 100/62, pulse 84, temperature 98.3 F (36.8 C), temperature source Oral, weight 113 lb (51.3 kg), SpO2 98 %.   Gen. Pleasant, well-nourished, in no distress, normal affect   HEENT: [place]/AT, face symmetric, no scleral icterus, PERRLA, nares patent without drainage.  L TM normal.  R canal occluded with cerumen. Lungs: no accessory muscle use Cardiovascular: RRR Neuro:  A&Ox3, CN II-XII intact, normal gait   Wt Readings from Last 3 Encounters:  04/28/18 113 lb (51.3 kg)  01/17/18 107 lb (48.5 kg) (11 %, Z= -1.25)*  01/16/18 107 lb 9.6 oz (48.8 kg) (11 %, Z= -1.21)*   * Growth percentiles are based on CDC (Girls, 2-20 Years) data.    Lab Results  Component Value Date   WBC 6.8 11/25/2017   HGB 12.6 11/25/2017   HCT 38.0 11/25/2017   PLT 360.0 11/25/2017   GLUCOSE 85 11/25/2017   ALT 5 11/25/2017    AST 10 11/25/2017   NA 141 11/25/2017   K 3.7 11/25/2017   CL 105 11/25/2017   CREATININE 0.92 11/25/2017   BUN 9 11/25/2017   CO2 28 11/25/2017   TSH 3.60 11/25/2017    Assessment/Plan:  Impacted cerumen of right ear  -consent obtained, R ear irrigated, pt tolerated procedure well -given handout -ok to use Debrox ear gtts prn -f/u prn  Abbe AmsterdamShannon Pax Reasoner, MD

## 2018-05-16 ENCOUNTER — Ambulatory Visit: Payer: BC Managed Care – PPO | Admitting: Family Medicine

## 2018-05-16 ENCOUNTER — Encounter: Payer: Self-pay | Admitting: Family Medicine

## 2018-05-16 VITALS — BP 100/70 | HR 76 | Temp 98.3°F | Wt 108.0 lb

## 2018-05-16 DIAGNOSIS — J029 Acute pharyngitis, unspecified: Secondary | ICD-10-CM

## 2018-05-16 DIAGNOSIS — J069 Acute upper respiratory infection, unspecified: Secondary | ICD-10-CM | POA: Diagnosis not present

## 2018-05-16 DIAGNOSIS — B079 Viral wart, unspecified: Secondary | ICD-10-CM | POA: Diagnosis not present

## 2018-05-16 DIAGNOSIS — B078 Other viral warts: Secondary | ICD-10-CM

## 2018-05-16 LAB — POCT RAPID STREP A (OFFICE): Rapid Strep A Screen: NEGATIVE

## 2018-05-16 MED ORDER — FLUTICASONE PROPIONATE 50 MCG/ACT NA SUSP: 1.0000 | Freq: Every day | NASAL | 0 refills | Status: DC

## 2018-05-16 NOTE — Addendum Note (Signed)
Addended by: Carola RhineKIGOTHO, Jearlene Bridwell N on: 05/16/2018 04:26 PM   Modules accepted: Orders

## 2018-05-16 NOTE — Patient Instructions (Addendum)
INSTRUCTIONS FOR UPPER RESPIRATORY INFECTION:   -plenty of rest and fluids   -nasal saline wash 2-3 times daily (use prepackaged nasal saline or bottled/distilled water if making your own)    -can use AFRIN nasal spray for drainage and nasal congestion - but do NOT use longer then 3-4 days   -can use tylenol (in no history of liver disease) or ibuprofen (if no history of kidney disease, bowel bleeding or significant heart disease) as directed for aches and sorethroat   -in the winter time, using a humidifier at night is helpful (please follow cleaning instructions)   -if you are taking a cough medication - use only as directed, may also try a teaspoon of honey to coat the throat and throat lozenges. If given a cough medication with codeine or hydrocodone or other narcotic please be advised that this contains a strong and  potentially addicting medication. Please follow instructions carefully, take as little as possible and only use AS NEEDED for severe cough. Discuss potential side effects with your pharmacy. Please do not drive or operate machinery while taking these types of medications. Please do not take other sedating medications, drugs or alcohol while taking this medication without discussing with your doctor.   -for sore throat, salt water gargles can help   -follow up if you have fevers, facial pain, tooth pain, difficulty breathing or are worsening or symptoms persist longer then expected    Upper Respiratory Infection, Adult Most upper respiratory infections (URIs) are a viral infection of the air passages leading to the lungs. A URI affects the nose, throat, and upper air passages. The most common type of URI is nasopharyngitis and is typically referred to as "the common cold." URIs run their course and usually go away on their own. Most of the time, a URI does not require medical attention, but sometimes a bacterial infection in the upper airways can follow a viral infection. This  is called a secondary infection. Sinus and middle ear infections are common types of secondary upper respiratory infections. Bacterial pneumonia can also complicate a URI. A URI can worsen asthma and chronic obstructive pulmonary disease (COPD). Sometimes, these complications can require emergency medical care and may be life threatening. What are the causes? Almost all URIs are caused by viruses. A virus is a type of germ and can spread from one person to another. What increases the risk? You may be at risk for a URI if:  You smoke.  You have chronic heart or lung disease.  You have a weakened defense (immune) system.  You are very young or very old.  You have nasal allergies or asthma.  You work in crowded or poorly ventilated areas.  You work in health care facilities or schools.  What are the signs or symptoms? Symptoms typically develop 2-3 days after you come in contact with a cold virus. Most viral URIs last 7-10 days. However, viral URIs from the influenza virus (flu virus) can last 14-18 days and are typically more severe. Symptoms may include:  Runny or stuffy (congested) nose.  Sneezing.  Cough.  Sore throat.  Headache.  Fatigue.  Fever.  Loss of appetite.  Pain in your forehead, behind your eyes, and over your cheekbones (sinus pain).  Muscle aches.  How is this diagnosed? Your health care provider may diagnose a URI by:  Physical exam.  Tests to check that your symptoms are not due to another condition such as: ? Strep throat. ? Sinusitis. ? Pneumonia. ?  Asthma.  How is this treated? A URI goes away on its own with time. It cannot be cured with medicines, but medicines may be prescribed or recommended to relieve symptoms. Medicines may help:  Reduce your fever.  Reduce your cough.  Relieve nasal congestion.  Follow these instructions at home:  Take medicines only as directed by your health care provider.  Gargle warm saltwater or take  cough drops to comfort your throat as directed by your health care provider.  Use a warm mist humidifier or inhale steam from a shower to increase air moisture. This may make it easier to breathe.  Drink enough fluid to keep your urine clear or pale yellow.  Eat soups and other clear broths and maintain good nutrition.  Rest as needed.  Return to work when your temperature has returned to normal or as your health care provider advises. You may need to stay home longer to avoid infecting others. You can also use a face mask and careful hand washing to prevent spread of the virus.  Increase the usage of your inhaler if you have asthma.  Do not use any tobacco products, including cigarettes, chewing tobacco, or electronic cigarettes. If you need help quitting, ask your health care provider. How is this prevented? The best way to protect yourself from getting a cold is to practice good hygiene.  Avoid oral or hand contact with people with cold symptoms.  Wash your hands often if contact occurs.  There is no clear evidence that vitamin C, vitamin E, echinacea, or exercise reduces the chance of developing a cold. However, it is always recommended to get plenty of rest, exercise, and practice good nutrition. Contact a health care provider if:  You are getting worse rather than better.  Your symptoms are not controlled by medicine.  You have chills.  You have worsening shortness of breath.  You have brown or red mucus.  You have yellow or brown nasal discharge.  You have pain in your face, especially when you bend forward.  You have a fever.  You have swollen neck glands.  You have pain while swallowing.  You have white areas in the back of your throat. Get help right away if:  You have severe or persistent: ? Headache. ? Ear pain. ? Sinus pain. ? Chest pain.  You have chronic lung disease and any of the following: ? Wheezing. ? Prolonged cough. ? Coughing up  blood. ? A change in your usual mucus.  You have a stiff neck.  You have changes in your: ? Vision. ? Hearing. ? Thinking. ? Mood. This information is not intended to replace advice given to you by your health care provider. Make sure you discuss any questions you have with your health care provider. Document Released: 05/04/2001 Document Revised: 07/11/2016 Document Reviewed: 02/13/2014 Elsevier Interactive Patient Education  2018 [employer].  Warts Warts are small growths on the skin. They are common and can occur on various areas of the body. A person may have one wart or multiple warts. Most warts are not painful, and they usually do not cause problems. However, warts can cause pain if they are large or occur in an area of the body where pressure will be applied to them, such as the bottom of the foot. In many cases, warts do not require treatment. They usually go away on their own over a period of many months to a couple years. Various treatments may be done for warts that cause problems  or do not go away. Sometimes, warts go away and then come back again. What are the causes? Warts are caused by a type of virus that is called human papillomavirus (HPV). This virus can spread from person to person through direct contact. Warts can also spread to other areas of the body when a person scratches a wart and then scratches another area of his or her body. What increases the risk? Warts are more likely to develop in:  People who are 49-77 years of age.  People who have a weakened body defense system (immune system).  What are the signs or symptoms? A wart may be round or oval or have an irregular shape. Most warts have a rough surface. Warts may range in color from skin color to light yellow, brown, or gray. They are generally less than  inch (1.3 cm) in size. Most warts are painless, but some can be painful when pressure is applied to them. How is this diagnosed? A wart can usually  be diagnosed from its appearance. In some cases, a tissue sample may be removed (biopsy) to be looked at under a microscope. How is this treated? In many cases, warts do not need treatment. If treatment is needed, options may include:  Applying medicated solutions, creams, or patches to the wart. These may be over-the-counter or prescription medicines that make the skin soft so that layers will gradually shed away. In many cases, the medicine is applied one or two times per day and covered with a bandage.  Putting duct tape over the top of the wart (occlusion). You will leave the tape in place for as long as told by your health care provider, then you will replace it with a new strip of tape. This is done until the wart goes away.  Freezing the wart with liquid nitrogen (cryotherapy).  Burning the wart with: ? Laser treatment. ? An electrified probe (electrocautery).  Injection of a medicine (Candida antigen) into the wart to help the body's immune system to fight off the wart.  Surgery to remove the wart.  Follow these instructions at home:  Apply over-the-counter and prescription medicines only as told by your health care provider.  Do not apply over-the-counter wart medicines to your face or genitals before you ask your health care provider if it is okay to do so.  Do not scratch or pick at a wart.  Wash your hands after you touch a wart.  Avoid shaving hair that is over a wart.  Keep all follow-up visits as told by your health care provider. This is important. Contact a health care provider if:  Your warts do not improve after treatment.  You have redness, swelling, or pain at the site of a wart.  You have bleeding from a wart that does not stop with light pressure.  You have diabetes and you develop a wart. This information is not intended to replace advice given to you by your health care provider. Make sure you discuss any questions you have with your health care  provider. Document Released: 08/18/2005 Document Revised: 04/21/2016 Document Reviewed: 02/03/2015 Elsevier Interactive Patient Education  2018 [employer].  Cryoablation, Care After This sheet gives you information about how to care for yourself after your procedure. Your health care provider may also give you more specific instructions. If you have problems or questions, contact your health care provider. What can I expect after the procedure? After the procedure, it is common to have:  Soreness around the  treatment area.  Mild pain and swelling in the treatment area.  Follow these instructions at home: Treatment area care   Follow instructions from your health care provider about how to take care of your incision. Make sure you: ? Wash your hands with soap and water before you change your bandage (dressing). If soap and water are not available, use hand sanitizer. ? Change your dressing as told by your health care provider. ? Leave stitches (sutures) in place. They may need to stay in place for 2 weeks or longer.  Check your treatment area every day for signs of infection. Check for: ? More redness, swelling, or pain. ? More fluid or blood. ? Warmth. ? Pus or a bad smell.  Keep the treated area clean, dry, and covered with a dressing until it has healed. Clean the area with soap and water or as told by your health care provider.  You may shower if your health care provider approves. If your bandage gets wet, change it right away. Activity  Follow instructions from your health care provider about any activity limitations.  Do not drive for 24 hours if you received a medicine to help you relax (sedative). General instructions  Take over-the-counter and prescription medicines only as told by your health care provider.  Keep all follow-up visits as told by your health care provider. This is important. Contact a health care provider if:  You do not have a bowel movement for  2 days.  You have nausea or vomiting.  You have more redness, swelling, or pain around your treatment area.  You have more fluid or blood coming from your treatment area.  Your treatment area feels warm to the touch.  You have pus or a bad smell coming from your treatment area.  You have a fever. Get help right away if:  You have severe pain.  You have trouble swallowing or breathing.  You have severe weakness or dizziness.  You have chest pain or shortness of breath. This information is not intended to replace advice given to you by your health care provider. Make sure you discuss any questions you have with your health care provider. Document Released: 08/29/2013 Document Revised: 05/28/2016 Document Reviewed: 04/07/2016 Elsevier Interactive Patient Education  [employer].

## 2018-05-16 NOTE — Progress Notes (Signed)
Subjective:    Patient ID: Elaine Montgomery, female    DOB: 07/29/1996, 22 y.o.   MRN: [id number]  No chief complaint on file.   HPI Patient was seen today for acute concern.  Pt endorses sore throat, rhinorrhea, ear pressure x2 days.  Patient has not tried anything for her symptoms.  Patient denies fever, chills, nausea, vomiting, cough.  Patient denies sick contacts.  Patient also has a wart on her left middle finger that she would like removed.  Past Medical History:  Diagnosis Date  . Headache     No Known Allergies  ROS General: Denies fever, chills, night sweats, changes in weight, changes in appetite  +body aches HEENT: Denies headaches, changes in vision   +sore throat, rhinorrhea, ear pressure CV: Denies CP, palpitations, SOB, orthopnea Pulm: Denies SOB, cough, wheezing GI: Denies abdominal pain, nausea, vomiting, diarrhea, constipation GU: Denies dysuria, hematuria, frequency, vaginal discharge Msk: Denies muscle cramps, joint pains Neuro: Denies weakness, numbness, tingling Skin: Denies rashes, bruising  + wart left middle finger Psych: Denies depression, anxiety, hallucinations     Objective:    Blood pressure 100/70, pulse 76, temperature 98.3 F (36.8 C), temperature source Oral, weight 108 lb (49 kg), SpO2 98 %.   Gen. Pleasant, well-nourished, in no distress, normal affect   HEENT: /AT, face symmetric, no scleral icterus, PERRLA, nares patent with clear drainage, pharynx with mild erythema no exudate. Lungs: no accessory muscle use, CTAB, no wheezes or rales Cardiovascular: RRR, no m/r/g, no peripheral edema Abdomen: BS present, soft, NT/ND Neuro:  A&Ox3, CN II-XII intact, normal gait Skin:  Warm, no lesions/ rash.  verruca on L third digit.   Wt Readings from Last 3 Encounters:  05/16/18 108 lb (49 kg)  04/28/18 113 lb (51.3 kg)  01/17/18 107 lb (48.5 kg) (11 %, Z= -1.25)*   * Growth percentiles are based on CDC (Girls, 2-20 Years) data.     Lab Results  Component Value Date   WBC 6.8 11/25/2017   HGB 12.6 11/25/2017   HCT 38.0 11/25/2017   PLT 360.0 11/25/2017   GLUCOSE 85 11/25/2017   ALT 5 11/25/2017   AST 10 11/25/2017   NA 141 11/25/2017   K 3.7 11/25/2017   CL 105 11/25/2017   CREATININE 0.92 11/25/2017   BUN 9 11/25/2017   CO2 28 11/25/2017   TSH 3.60 11/25/2017    Assessment/Plan:  Viral URI  -Supportive care: Gargle with warm salt water or Chloraseptic spray, Tylenol as needed for any pain/discomfort, okay to use OTC cold medication. -Given handout - Plan: fluticasone (FLONASE) 50 MCG/ACT nasal spray  Verruca warts (infectious) -Consent obtained, cryotherapy done, patient tolerated procedure well -See procedure note below -Aftercare instructions given  Cryotherapy procedure note  Reason: Verruca Location: Left hand third digit  Consent obtained.  R/b/a reviewed.  Liquid nitrogen was applied using the liquid nitrogen gun without difficulty. Tolerated well without complications.  After care reviewed.  Follow-up PRN  Abbe AmsterdamShannon Memphis [place]oteau, MD

## 2018-06-08 ENCOUNTER — Other Ambulatory Visit: Payer: Self-pay | Admitting: Family Medicine

## 2018-06-08 DIAGNOSIS — J069 Acute upper respiratory infection, unspecified: Secondary | ICD-10-CM

## 2018-06-29 ENCOUNTER — Telehealth: Payer: Self-pay | Admitting: Internal Medicine

## 2018-06-29 ENCOUNTER — Ambulatory Visit: Payer: BC Managed Care – PPO | Admitting: Internal Medicine

## 2018-06-29 ENCOUNTER — Other Ambulatory Visit: Payer: Self-pay | Admitting: Family Medicine

## 2018-06-29 ENCOUNTER — Encounter: Payer: Self-pay | Admitting: Internal Medicine

## 2018-06-29 VITALS — BP 98/62 | HR 91 | Temp 98.8°F | Wt 109.6 lb

## 2018-06-29 DIAGNOSIS — J039 Acute tonsillitis, unspecified: Secondary | ICD-10-CM

## 2018-06-29 DIAGNOSIS — J029 Acute pharyngitis, unspecified: Secondary | ICD-10-CM

## 2018-06-29 LAB — POCT RAPID STREP A (OFFICE): RAPID STREP A SCREEN: NEGATIVE

## 2018-06-29 MED ORDER — AMOXICILLIN 500 MG PO CAPS: 500.0000 mg | ORAL_CAPSULE | Freq: Two times a day (BID) | ORAL | 0 refills | Status: DC

## 2018-06-29 MED ORDER — AZITHROMYCIN 250 MG PO TABS: ORAL_TABLET | ORAL | 0 refills | Status: DC

## 2018-06-29 NOTE — Progress Notes (Signed)
Chief Complaint  Patient presents with  . Sore Throat    x 2 days. Hard to swallow. White patches on left side. Pt notes headache, N/V and body aches. Denies fever, cough, congestion.  H/o strep throat    HPI: Elaine Montgomery 22 y.o.  sda   PCP NA   Co sx  2 days and worse some   Nnausea no fever   But felt chilles   Hurts to swallow more on left   Today  .  whitw spots no congestion or cough  Or self rx   No hx or tonsillitis recently .  No hx mono.  ROS: See pertinent positives and negatives per HPI. 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 Headache     Family History  Problem Relation Age of Onset  . High blood pressure Maternal Grandmother        Died at 1953  . Alzheimer's disease Maternal Grandfather        Died at 6183  . Cancer Paternal Grandfather        Age at time of death unknown  . Breast cancer Paternal Aunt   . Colon cancer Neg Hx   . Esophageal cancer Neg Hx   . Stomach cancer Neg Hx     Social History   Socioeconomic History  . Marital status: Single    Spouse name: Not on file  . Number of children: Not on file  . Years of education: Not on file  . Highest education level: Not on file  Occupational History  . Not on file  Social Needs  . Financial resource strain: Not on file  . Food insecurity:    Worry: Not on file    Inability: Not on file  . Transportation needs:    Medical: Not on file    Non-medical: Not on file  Tobacco Use  . Smoking status: Never Smoker  . Smokeless tobacco: Never Used  Substance and Sexual Activity  . Alcohol use: Not on file  . Drug use: Yes    Types: Marijuana    Comment: every day  . Sexual activity: Never  Lifestyle  . Physical activity:    Days per week: Not on file    Minutes per session: Not on file  . Stress: Not on file  Relationships  . Social connections:    Talks on phone: Not on file    Gets together: Not on file    Attends religious service: Not on  file    Active member of club or organization: Not on file    Attends meetings of clubs or organizations: Not on file    Relationship status: Not on file  Other Topics Concern  . Not on file  Social History Narrative  . Not on file    Outpatient Medications Prior to Visit  Medication Sig Dispense Refill  . fluticasone (FLONASE) 50 MCG/ACT nasal spray SPRAY 1 SPRAY INTO EACH NOSTRIL EVERY DAY 16 g 0  . naproxen (NAPROSYN) 500 MG tablet Take 500 mg by mouth as needed.     Marland Kitchen. omeprazole (PRILOSEC) 20 MG capsule Take 1 capsule (20 mg total) by mouth 2 (two) times daily before a meal. 60 capsule 3   Facility-Administered Medications Prior to Visit  Medication Dose Route Frequency Provider Last Rate Last Dose  . 0.9 %  sodium chloride infusion  500 mL Intravenous Once Hilarie FredricksonPerry, John N, MD  EXAM:  BP 98/62 (BP Location: Right Arm, Patient Position: Sitting, Cuff Size: Normal)   Pulse 91   Temp 98.8 F (37.1 C) (Oral)   Wt 109 lb 9.6 oz (49.7 kg)   SpO2 99%   BMI 18.81 kg/m   Body mass index is 18.81 kg/m.  GENERAL: vitals reviewed and listed above, alert, oriented, appears well hydrated and in no acute distress nl speech HEENT: atraumatic, conjunctiva  clear, no obvious abnormalities on inspection of external nose and earstmx nl OP : left tonsil 2+  Exudate  Right 1 no exudeat no palatal petechia   NECK: no obvious masses on inspection  Tender left ac area  No pc nodes  LUNGS: clear to auscultation bilaterally, no wheezes, rales or rhonchi, good air movement CV: HRRR, no clubbing cyanosis or  peripheral edema nl cap refill  Abdomen:  Sof,t normal bowel sounds without hepatosplenomegaly, no guarding rebound or masses no CVA tenderness MS: moves all extremities without noticeable focal  abnormality PSYCH: pleasant and cooperative, no obvious depression or anxiety rs is neg  ASSESSMENT AND PLAN:  Discussed the following assessment and plan:  Tonsillitis - Plan: POCT rapid  strep A, Culture, Group A Strep semmingly unilateral  At this  Time    Expectant management.    Expectant management.  Sx gargles ibuprofen  etc  -Patient advised to return or notify health care team  if symptoms worsen ,persist or new concerns arise.  Patient Instructions  Checking for strep infection  Begin antibiotic   Awaiting the results .   Gargles and  Ibuprofen  Also expect improvement in next 2-3 days and if worse  Plan fu visit or contact the medical team .      Tonsillitis Tonsillitis is an infection of the throat that causes the tonsils to become red, tender, and swollen. Tonsils are collections of lymphoid tissue at the back of the throat. Each tonsil has crevices (crypts). Tonsils help fight nose and throat infections and keep infection from spreading to other parts of the body for the first 18 months of life. What are the causes? Sudden (acute) tonsillitis is usually caused by infection with streptococcal bacteria. Long-lasting (chronic) tonsillitis occurs when the crypts of the tonsils become filled with pieces of food and bacteria, which makes it easy for the tonsils to become repeatedly infected. What are the signs or symptoms? Symptoms of tonsillitis include:  A sore throat, with possible difficulty swallowing.  White patches on the tonsils.  Fever.  Tiredness.  New episodes of snoring during sleep, when you did not snore before.  Small, foul-smelling, yellowish-white pieces of material (tonsilloliths) that you occasionally cough up or spit out. The tonsilloliths can also cause you to have bad breath.  How is this diagnosed? Tonsillitis can be diagnosed through a physical exam. Diagnosis can be confirmed with the results of lab tests, including a throat culture. How is this treated? The goals of tonsillitis treatment include the reduction of the severity and duration of symptoms and prevention of associated conditions. Symptoms of tonsillitis can be improved with  the use of steroids to reduce the swelling. Tonsillitis caused by bacteria can be treated with antibiotic medicines. Usually, treatment with antibiotic medicines is started before the cause of the tonsillitis is known. However, if it is determined that the cause is not bacterial, antibiotic medicines will not treat the tonsillitis. If attacks of tonsillitis are severe and frequent, your health care provider may recommend surgery to remove the tonsils (tonsillectomy). Follow  these instructions at home:  Rest as much as possible and get plenty of sleep.  Drink plenty of fluids. While the throat is very sore, eat soft foods or liquids, such as sherbet, soups, or instant breakfast drinks.  Eat frozen ice pops.  Gargle with a warm or cold liquid to help soothe the throat. Mix 1/4 teaspoon of salt and 1/4 teaspoon of baking soda in 8 oz of water. Contact a health care provider if:  Large, tender lumps develop in your neck.  A rash develops.  A green, yellow-brown, or bloody substance is coughed up.  You are unable to swallow liquids or food for 24 hours.  You notice that only one of the tonsils is swollen. Get help right away if:  You develop any new symptoms such as vomiting, severe headache, stiff neck, chest pain, or trouble breathing or swallowing.  You have severe throat pain along with drooling or voice changes.  You have severe pain, unrelieved with recommended medications.  You are unable to fully open the mouth.  You develop redness, swelling, or severe pain anywhere in the neck.  You have a fever. This information is not intended to replace advice given to you by your health care provider. Make sure you discuss any questions you have with your health care provider. Document Released: 08/18/2005 Document Revised: 04/15/2016 Document Reviewed: 04/27/2013 Elsevier Interactive Patient Education  2017 [employer].     [place]. Naliya Gish M.D.

## 2018-06-29 NOTE — Telephone Encounter (Signed)
See note in chart.  Rx for Azithromycin sent to pharmacy.  Pt called.  Pt states she had amoxicillin in the past without issue.

## 2018-06-29 NOTE — Telephone Encounter (Signed)
I will forward to PCP for advice today.

## 2018-06-29 NOTE — Telephone Encounter (Signed)
I spoke with pt, she was seen here today by Dr. Fabian SharpPanosh and given antibiotic. Pt went to pharmacy to pick up and they advised penicillin was on her drug allergy list. She is requesting something else to be called in.

## 2018-06-29 NOTE — Telephone Encounter (Signed)
Pt calling to check and see if the amoxicillin (AMOXIL) 500 MG capsule  Can be sent to another pharmacy or if something else can be ordered? Please advise.

## 2018-06-29 NOTE — Progress Notes (Signed)
Phone msg from pt stating pharmacy has Amoxicillin listed as an allergy.  Unclear if this is accurate as pt previously stated NKDAs.  Will send in rx for Azithromycin to err on the side of caution.   Update: called pt to inform her of above.  Pt states she has taken Amoxicillin in the past without issue.  States pharmacy to update their records.  Pt's mother has picked up the rx and pt plans to start the med.    Elaine AmsterdamShannon Paxton Kanaan, MD

## 2018-06-29 NOTE — Telephone Encounter (Signed)
Copied from CRM [id number]. Topic: Quick Communication - Rx Refill/Question >> Jun 29, 2018 11:19 AM Elaine Montgomery, Elaine Montgomery, NT wrote: Medication:  amoxicillin (AMOXIL) 500 MG capsule   Has the patient contacted their pharmacy? yes  (Agent: If no, request that the patient contact the pharmacy for the refill. (Agent: If yes, when and what did the pharmacy advise?  Preferred Pharmacy (with phone number or street name [id number] [place] [address] [phone] (Phone) [phone] (Fax)  The pharmacy called and said the patient has an allergy  to the above medication ,they are unable to make contact with the patient .please advise   Agent: Please be advised that RX refills may take up to 3 business days. We ask that you follow-up with your pharmacy.

## 2018-06-29 NOTE — Patient Instructions (Addendum)
Checking for strep infection  Begin antibiotic   Awaiting the results .   Gargles and  Ibuprofen  Also expect improvement in next 2-3 days and if worse  Plan fu visit or contact the medical team .      Tonsillitis Tonsillitis is an infection of the throat that causes the tonsils to become red, tender, and swollen. Tonsils are collections of lymphoid tissue at the back of the throat. Each tonsil has crevices (crypts). Tonsils help fight nose and throat infections and keep infection from spreading to other parts of the body for the first 18 months of life. What are the causes? Sudden (acute) tonsillitis is usually caused by infection with streptococcal bacteria. Long-lasting (chronic) tonsillitis occurs when the crypts of the tonsils become filled with pieces of food and bacteria, which makes it easy for the tonsils to become repeatedly infected. What are the signs or symptoms? Symptoms of tonsillitis include:  A sore throat, with possible difficulty swallowing.  White patches on the tonsils.  Fever.  Tiredness.  New episodes of snoring during sleep, when you did not snore before.  Small, foul-smelling, yellowish-white pieces of material (tonsilloliths) that you occasionally cough up or spit out. The tonsilloliths can also cause you to have bad breath.  How is this diagnosed? Tonsillitis can be diagnosed through a physical exam. Diagnosis can be confirmed with the results of lab tests, including a throat culture. How is this treated? The goals of tonsillitis treatment include the reduction of the severity and duration of symptoms and prevention of associated conditions. Symptoms of tonsillitis can be improved with the use of steroids to reduce the swelling. Tonsillitis caused by bacteria can be treated with antibiotic medicines. Usually, treatment with antibiotic medicines is started before the cause of the tonsillitis is known. However, if it is determined that the cause is not bacterial,  antibiotic medicines will not treat the tonsillitis. If attacks of tonsillitis are severe and frequent, your health care provider may recommend surgery to remove the tonsils (tonsillectomy). Follow these instructions at home:  Rest as much as possible and get plenty of sleep.  Drink plenty of fluids. While the throat is very sore, eat soft foods or liquids, such as sherbet, soups, or instant breakfast drinks.  Eat frozen ice pops.  Gargle with a warm or cold liquid to help soothe the throat. Mix 1/4 teaspoon of salt and 1/4 teaspoon of baking soda in 8 oz of water. Contact a health care provider if:  Large, tender lumps develop in your neck.  A rash develops.  A green, yellow-brown, or bloody substance is coughed up.  You are unable to swallow liquids or food for 24 hours.  You notice that only one of the tonsils is swollen. Get help right away if:  You develop any new symptoms such as vomiting, severe headache, stiff neck, chest pain, or trouble breathing or swallowing.  You have severe throat pain along with drooling or voice changes.  You have severe pain, unrelieved with recommended medications.  You are unable to fully open the mouth.  You develop redness, swelling, or severe pain anywhere in the neck.  You have a fever. This information is not intended to replace advice given to you by your health care provider. Make sure you discuss any questions you have with your health care provider. Document Released: 08/18/2005 Document Revised: 04/15/2016 Document Reviewed: 04/27/2013 Elsevier Interactive Patient Education  2017 [employer]

## 2018-06-30 NOTE — Telephone Encounter (Signed)
Noted  

## 2018-07-01 LAB — CULTURE, GROUP A STREP
MICRO NUMBER: 90940395
SPECIMEN QUALITY:: ADEQUATE

## 2018-07-26 ENCOUNTER — Encounter (HOSPITAL_COMMUNITY): Payer: Self-pay | Admitting: Emergency Medicine

## 2018-07-26 ENCOUNTER — Other Ambulatory Visit: Payer: Self-pay

## 2018-07-26 ENCOUNTER — Ambulatory Visit (HOSPITAL_COMMUNITY)
Admission: EM | Admit: 2018-07-26 | Discharge: 2018-07-26 | Disposition: A | Discharge status: Home / Self Care | Payer: BC Managed Care – PPO | Attending: Family Medicine | Admitting: Family Medicine

## 2018-07-26 DIAGNOSIS — M25561 Pain in right knee: Secondary | ICD-10-CM

## 2018-07-26 DIAGNOSIS — M542 Cervicalgia: Secondary | ICD-10-CM | POA: Diagnosis not present

## 2018-07-26 DIAGNOSIS — S0990XA Unspecified injury of head, initial encounter: Secondary | ICD-10-CM
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
## 2019-08-01 ENCOUNTER — Telehealth: Payer: Self-pay | Admitting: Family Medicine

## 2019-08-01 NOTE — Telephone Encounter (Signed)
Copied from [place] [id number]. Topic: General - Other >> Aug 01, 2019 10:04 AM Celene Kras A wrote: Reason for CRM: Pt called and is requesting to have STD testing done. Pt is requesting to have the test done with another provider if she can get in earlier. Please advise.

## 2019-08-07 NOTE — Telephone Encounter (Signed)
LVM for pt to call office and schedule an office visit

## 2019-09-28 NOTE — Telephone Encounter (Signed)
Pt is scheduled for an office visit on 10/11/19 with Dr Volanda Napoleon

## 2019-10-11 ENCOUNTER — Other Ambulatory Visit: Payer: Self-pay

## 2019-10-11 ENCOUNTER — Ambulatory Visit (INDEPENDENT_AMBULATORY_CARE_PROVIDER_SITE_OTHER): Payer: BC Managed Care – PPO

## 2019-10-11 ENCOUNTER — Ambulatory Visit (INDEPENDENT_AMBULATORY_CARE_PROVIDER_SITE_OTHER): Payer: BC Managed Care – PPO | Admitting: Family Medicine

## 2019-10-11 ENCOUNTER — Encounter: Payer: Self-pay | Admitting: Family Medicine

## 2019-10-11 VITALS — BP 96/78 | HR 84 | Temp 97.7°F | Wt 114.0 lb

## 2019-10-11 DIAGNOSIS — Z23 Encounter for immunization: Secondary | ICD-10-CM

## 2019-10-11 DIAGNOSIS — Z0000 Encounter for general adult medical examination without abnormal findings: Secondary | ICD-10-CM | POA: Diagnosis not present

## 2019-10-11 DIAGNOSIS — R630 Anorexia: Secondary | ICD-10-CM | POA: Diagnosis not present

## 2019-10-11 DIAGNOSIS — M25532 Pain in left wrist: Secondary | ICD-10-CM

## 2019-10-11 NOTE — Progress Notes (Signed)
Subjective:     Elaine Montgomery is a 23 y.o. female and is here for a comprehensive physical exam. The patient reports problems - L wrist pain, decreased appetite.  Pt notes intermittent L wrist pain after a fall while rollar skating.  Pt backward landing on her butt with arms at her side and hands open. Pt denies edema, erythema, numbness, tingling of wrist, hand, or fingers.  Pain with certain movements.  Has not taken anything for the pain.  Pt is R handed.  Pt endorses continued decreased appetite.  Will start eating, then stop after a few bite.  Endorses occasional stomach pain, but mostly after not eating all day.  May have bacon or sausage, egg, and cheese breakfast sandwich.  Denies emesis, diarrhea, constipation, increased stress  Pt does note some anxiety about not being able to finish a meal.  Pap up to date. Social History   Socioeconomic History  . Marital status: Single    Spouse name: Not on file  . Number of children: Not on file  . Years of education: Not on file  . Highest education level: Not on file  Occupational History  . Not on file  Social Needs  . Financial resource strain: Not on file  . Food insecurity    Worry: Not on file    Inability: Not on file  . Transportation needs    Medical: Not on file    Non-medical: Not on file  Tobacco Use  . Smoking status: Never Smoker  . Smokeless tobacco: Never Used  Substance and Sexual Activity  . Alcohol use: Never    Alcohol/week: 0.0 standard drinks    Frequency: Never  . Drug use: Yes    Types: Marijuana    Comment: every day  . Sexual activity: Never    Birth control/protection: None  Lifestyle  . Physical activity    Days per week: Not on file    Minutes per session: Not on file  . Stress: Not on file  Relationships  . Social Musician on phone: Not on file    Gets together: Not on file    Attends religious service: Not on file    Active member of club or organization: Not on file   Attends meetings of clubs or organizations: Not on file    Relationship status: Not on file  . Intimate partner violence    Fear of current or ex partner: Not on file    Emotionally abused: Not on file    Physically abused: Not on file    Forced sexual activity: Not on file  Other Topics Concern  . Not on file  Social History Narrative  . Not on file   Health Maintenance  Topic Date Due  . HIV Screening  03/22/2013  . TETANUS/TDAP  09/26/2018  . PAP-Cervical Cytology Screening  03/23/2019  . PAP SMEAR-Modifier  03/23/2019  . INFLUENZA VACCINE  Completed    The following portions of the patient's history were reviewed and updated as appropriate: allergies, current medications, past family history, past medical history, past social history, past surgical history and problem list.  Review of Systems Pertinent items noted in HPI and remainder of comprehensive ROS otherwise negative.   Objective:    BP 96/78 (BP Location: Left Arm, Patient Position: Sitting, Cuff Size: Normal)   Pulse 84   Temp 97.7 F (36.5 C) (Temporal)   Wt 114 lb (51.7 kg)   LMP 10/06/2019 (Exact Date)  SpO2 98%   BMI 19.57 kg/m  General appearance: alert, cooperative, appears stated age and no distress Head: Normocephalic, without obvious abnormality, atraumatic Eyes: conjunctivae/corneas clear. PERRL, EOM's intact. Fundi benign. Ears: normal TM's and external ear canals both ears Nose: Nares normal. Septum midline. Mucosa normal. No drainage or sinus tenderness. Throat: lips, mucosa, and tongue normal; teeth and gums normal Neck: no adenopathy, no carotid bruit, no JVD, supple, symmetrical, trachea midline and thyroid not enlarged, symmetric, no tenderness/mass/nodules Lungs: clear to auscultation bilaterally Heart: regular rate and rhythm, S1, S2 normal, no murmur, click, rub or gallop Abdomen: soft, non-tender; bowel sounds normal; no masses,  no organomegaly Extremities: extremities normal,  atraumatic, no cyanosis or edema and L wrist tenderness with flexion.  Negative Tinel and Phalen's bilaterally.  No deformities, edema, or erythema. Pulses: 2+ and symmetric Skin: Skin color, texture, turgor normal. No rashes or lesions Lymph nodes: Cervical, supraclavicular, and axillary nodes normal. Neurologic: Alert and oriented X 3, normal strength and tone. Normal symmetric reflexes. Normal coordination and gait    Assessment:    Healthy female exam.      Plan:     Anticipatory guidance given including wearing seatbelts, smoke detectors in the home, increasing physical activity, increasing p.o. intake of water and vegetables. -will obtain labs -pap up to date, followed by OB/Gyn, done 09/06/19. -influenza vaccine give this visit. -given handout -next CPE in 1 yr. See After Visit Summary for Counseling Recommendations    Need for immunization against influenza  - Plan: Flu Vaccine QUAD 6+ mos PF IM (Fluarix Quad PF)  Left wrist pain  -supportive care -consider referral to Sports med or Ortho based on imaging. - Plan: DG Wrist Complete Left, CBC (no diff)  Decreased appetite  - Plan: CMP, Lipase, TSH, Ambulatory referral to Gastroenterology  F/u prn  Grier Mitts, MD

## 2019-10-11 NOTE — Patient Instructions (Signed)
Preventive Care 20-21 Years Old, Female Preventive care refers to visits with your health care provider and lifestyle choices that can promote health and wellness. This includes:  A yearly physical exam. This may also be called an annual well check.  Regular dental visits and eye exams.  Immunizations.  Screening for certain conditions.  Healthy lifestyle choices, such as eating a healthy diet, getting regular exercise, not using drugs or products that contain nicotine and tobacco, and limiting alcohol use. What can I expect for my preventive care visit? Physical exam Your health care provider will check your:  Height and weight. This may be used to calculate body mass index (BMI), which tells if you are at a healthy weight.  Heart rate and blood pressure.  Skin for abnormal spots. Counseling Your health care provider may ask you questions about your:  Alcohol, tobacco, and drug use.  Emotional well-being.  Home and relationship well-being.  Sexual activity.  Eating habits.  Work and work Statistician.  Method of birth control.  Menstrual cycle.  Pregnancy history. What immunizations do I need?  Influenza (flu) vaccine  This is recommended every year. Tetanus, diphtheria, and pertussis (Tdap) vaccine  You may need a Td booster every 21 years. Varicella (chickenpox) vaccine  You may need this if you have not been vaccinated. Human papillomavirus (HPV) vaccine  If recommended by your health care provider, you may need three doses over 6 months. Measles, mumps, and rubella (MMR) vaccine  You may need at least one dose of MMR. You may also need a second dose. Meningococcal conjugate (MenACWY) vaccine  One dose is recommended if you are age 21-21 years and a first-year college student living in a residence hall, or if you have one of several medical conditions. You may also need additional booster doses. Pneumococcal conjugate (PCV13) vaccine  You may need  this if you have certain conditions and were not previously vaccinated. Pneumococcal polysaccharide (PPSV23) vaccine  You may need one or two doses if you smoke cigarettes or if you have certain conditions. Hepatitis A vaccine  You may need this if you have certain conditions or if you travel or work in places where you may be exposed to hepatitis A. Hepatitis B vaccine  You may need this if you have certain conditions or if you travel or work in places where you may be exposed to hepatitis B. Haemophilus influenzae type b (Hib) vaccine  You may need this if you have certain conditions. You may receive vaccines as individual doses or as more than one vaccine together in one shot (combination vaccines). Talk with your health care provider about the risks and benefits of combination vaccines. What tests do I need?  Blood tests  Lipid and cholesterol levels. These may be checked every 5 years starting at age 21.  Hepatitis C test.  Hepatitis B test. Screening  Diabetes screening. This is done by checking your blood sugar (glucose) after you have not eaten for a while (fasting).  Sexually transmitted disease (STD) testing.  BRCA-related cancer screening. This may be done if you have a family history of breast, ovarian, tubal, or peritoneal cancers.  Pelvic exam and Pap test. This may be done every 3 years starting at age 21. Starting at age 68, this may be done every 5 years if you have a Pap test in combination with an HPV test. Talk with your health care provider about your test results, treatment options, and if necessary, the need for more tests.  Follow these instructions at home: Eating and drinking   Eat a diet that includes fresh fruits and vegetables, whole grains, lean protein, and low-fat dairy.  Take vitamin and mineral supplements as recommended by your health care provider.  Do not drink alcohol if: ? Your health care provider tells you not to drink. ? You are  pregnant, may be pregnant, or are planning to become pregnant.  If you drink alcohol: ? Limit how much you have to 0-1 drink a day. ? Be aware of how much alcohol is in your drink. In the [place] one drink equals one 12 oz bottle of beer (355 mL), one 5 oz glass of wine (148 mL), or one 1 oz glass of hard liquor (44 mL). Lifestyle  Take daily care of your teeth and gums.  Stay active. Exercise for at least 30 minutes on 5 or more days each week.  Do not use any products that contain nicotine or tobacco, such as cigarettes, e-cigarettes, and chewing tobacco. If you need help quitting, ask your health care provider.  If you are sexually active, practice safe sex. Use a condom or other form of birth control (contraception) in order to prevent pregnancy and STIs (sexually transmitted infections). If you plan to become pregnant, see your health care provider for a preconception visit. What's next?  Visit your health care provider once a year for a well check visit.  Ask your health care provider how often you should have your eyes and teeth checked.  Stay up to date on all vaccines. This information is not intended to replace advice given to you by your health care provider. Make sure you discuss any questions you have with your health care provider. Document Released: 01/04/2002 Document Revised: 07/20/2018 Document Reviewed: 07/20/2018 Elsevier Patient Education  2020 [employer].  Wrist Pain, Adult There are many things that can cause wrist pain. Some common causes include:  An injury to the wrist area, such as a sprain, strain, or fracture.  Overuse of the joint.  A condition that causes increased pressure on a nerve in the wrist (carpal tunnel syndrome).  Wear and tear of the joints that occurs with aging (osteoarthritis).  A variety of other types of arthritis. Sometimes, the cause of wrist pain is not known. Often, the pain goes away when you follow instructions from your  health care provider for relieving pain at home, such as resting or icing the wrist. If your wrist pain continues, it is important to tell your health care provider. Follow these instructions at home:  Rest the wrist area for at least 48 hours or as long as told by your health care provider.  If a splint or elastic bandage has been applied, use it as told by your health care provider. ? Remove the splint or bandage only as told by your health care provider. ? Loosen the splint or bandage if your fingers tingle, become numb, or turn cold or blue.  If directed, apply ice to the injured area. ? If you have a removable splint or elastic bandage, remove it as told by your health care provider. ? Put ice in a plastic bag. ? Place a towel between your skin and the bag or between your splint or bandage and the bag. ? Leave the ice on for 20 minutes, 2-3 times a day.   Keep your arm raised (elevated) above the level of your heart while you are sitting or lying down.  Take over-the-counter and prescription medicines  only as told by your health care provider.  Keep all follow-up visits as told by your health care provider. This is important. Contact a health care provider if:  You have a sudden sharp pain in the wrist, hand, or arm that is different or new.  The swelling or bruising on your wrist or hand gets worse.  Your skin becomes red, gets a rash, or has open sores.  Your pain does not get better or it gets worse. Get help right away if:  You lose feeling in your fingers or hand.  Your fingers turn white, very red, or cold and blue.  You cannot move your fingers.  You have a fever or chills. This information is not intended to replace advice given to you by your health care provider. Make sure you discuss any questions you have with your health care provider. Document Released: 08/18/2005 Document Revised: 10/21/2017 Document Reviewed: 05/27/2016 Elsevier Patient Education  2020  Elsevier Inc.  

## 2019-10-12 ENCOUNTER — Encounter: Payer: Self-pay | Admitting: Family Medicine

## 2019-10-12 LAB — COMPREHENSIVE METABOLIC PANEL
ALT: 7 U/L (ref 0–35)
AST: 12 U/L (ref 0–37)
Albumin: 4.8 g/dL (ref 3.5–5.2)
Alkaline Phosphatase: 39 U/L (ref 39–117)
BUN: 11 mg/dL (ref 6–23)
CO2: 27 mEq/L (ref 19–32)
Calcium: 9.4 mg/dL (ref 8.4–10.5)
Chloride: 104 mEq/L (ref 96–112)
Creatinine, Ser: 1 mg/dL (ref 0.40–1.20)
GFR: 69.62 mL/min (ref >60.00)
Glucose, Bld: 85 mg/dL (ref 70–99)
Potassium: 4 mEq/L (ref 3.5–5.1)
Sodium: 138 mEq/L (ref 135–145)
Total Bilirubin: 0.7 mg/dL (ref 0.2–1.2)
Total Protein: 7.2 g/dL (ref 6.0–8.3)

## 2019-10-12 LAB — TSH: TSH: 1.83 u[IU]/mL (ref 0.35–4.50)

## 2019-10-12 LAB — CBC
HCT: 35.4 % — ABNORMAL LOW (ref 36.0–46.0)
Hemoglobin: 11.8 g/dL — ABNORMAL LOW (ref 12.0–15.0)
MCHC: 33.4 g/dL (ref 30.0–36.0)
MCV: 94.5 fl (ref 78.0–100.0)
Platelets: 320 10*3/uL (ref 150.0–400.0)
RBC: 3.74 Mil/uL — ABNORMAL LOW (ref 3.87–5.11)
RDW: 14.6 % (ref 11.5–15.5)
WBC: 4.6 10*3/uL (ref 4.0–10.5)

## 2019-10-12 LAB — LIPASE: Lipase: 13 U/L (ref 11.0–59.0)

## 2019-11-20 ENCOUNTER — Ambulatory Visit: Payer: BC Managed Care – PPO | Attending: Internal Medicine

## 2019-11-20 DIAGNOSIS — Z20822 Contact with and (suspected) exposure to covid-19: Secondary | ICD-10-CM

## 2019-11-21 LAB — NOVEL CORONAVIRUS, NAA: SARS-CoV-2, NAA: NOT DETECTED

## 2020-02-01 ENCOUNTER — Other Ambulatory Visit: Payer: Self-pay

## 2020-02-04 ENCOUNTER — Ambulatory Visit (INDEPENDENT_AMBULATORY_CARE_PROVIDER_SITE_OTHER): Payer: BC Managed Care – PPO

## 2020-02-04 DIAGNOSIS — Z111 Encounter for screening for respiratory tuberculosis: Secondary | ICD-10-CM | POA: Diagnosis not present

## 2020-02-04 DIAGNOSIS — Z23 Encounter for immunization: Secondary | ICD-10-CM

## 2020-02-04 NOTE — Progress Notes (Signed)
Per orders of Dr. Abbe Amsterdam, injection of PPD given by Carola Rhine. Patient tolerated injection well.

## 2020-02-05 ENCOUNTER — Telehealth: Payer: Self-pay | Admitting: Family Medicine

## 2020-02-05 NOTE — Telephone Encounter (Signed)
Pt is needing to get the Tdap inj for her work. She would like to get it tomorrow when she comes back for her Tb test reading. Pt can be reached at [phone]

## 2020-02-06 DIAGNOSIS — Z23 Encounter for immunization: Secondary | ICD-10-CM

## 2020-02-06 LAB — TB SKIN TEST
Induration: 0 mm
TB Skin Test: NEGATIVE

## 2020-02-06 NOTE — Addendum Note (Signed)
Addended by: Carola Rhine on: 02/06/2020 05:10 PM   Modules accepted: Orders

## 2020-02-06 NOTE — Telephone Encounter (Signed)
Spoke with pt  advised that she can get her Tdap when she gets her TB read, pt state that she will be here today before 5 pm

## 2020-02-11 ENCOUNTER — Telehealth: Payer: Self-pay

## 2020-02-11 NOTE — Telephone Encounter (Signed)
Pt had a PPD test reading/ TDAP Immunization on 02/06/2020, pt dropped off a Health Examination Certificate for [employer], Form completed and faxed.

## 2020-07-21 ENCOUNTER — Telehealth: Payer: Self-pay | Admitting: Family Medicine

## 2020-07-21 NOTE — Telephone Encounter (Signed)
Pt is wanting a copy of her immunization records.   Please call her at [phone]

## 2020-07-22 NOTE — Telephone Encounter (Signed)
Pt immunization records printed and placed in the front office filing cabinet for pt, pt is aware

## 2020-08-25 ENCOUNTER — Ambulatory Visit (INDEPENDENT_AMBULATORY_CARE_PROVIDER_SITE_OTHER): Payer: BC Managed Care – PPO | Admitting: Family Medicine

## 2020-08-25 ENCOUNTER — Other Ambulatory Visit: Payer: Self-pay

## 2020-08-25 ENCOUNTER — Other Ambulatory Visit (HOSPITAL_COMMUNITY)
Admission: RE | Admit: 2020-08-25 | Discharge: 2020-08-25 | Disposition: A | Discharge status: Home / Self Care | Payer: BC Managed Care – PPO | Source: Ambulatory Visit | Attending: Family Medicine | Admitting: Family Medicine

## 2020-08-25 ENCOUNTER — Encounter: Payer: Self-pay | Admitting: Family Medicine

## 2020-08-25 VITALS — BP 98/68 | HR 64 | Temp 98.5°F | Wt 106.8 lb

## 2020-08-25 DIAGNOSIS — Z113 Encounter for screening for infections with a predominantly sexual mode of transmission: Secondary | ICD-10-CM | POA: Diagnosis not present

## 2020-08-25 DIAGNOSIS — M25561 Pain in right knee: Secondary | ICD-10-CM

## 2020-08-25 DIAGNOSIS — M25469 Effusion, unspecified knee: Secondary | ICD-10-CM

## 2020-08-25 MED ORDER — MELOXICAM 7.5 MG PO TABS: 7.5000 mg | ORAL_TABLET | Freq: Every day | ORAL | 0 refills | Status: DC

## 2020-08-25 NOTE — Patient Instructions (Addendum)
Knee Effusion  Knee effusion means that you have extra fluid in your knee. This can cause pain. Your knee may be more difficult to bend and move. What are the causes? Common causes are:  Arthritis.  Infection.  Injury.  Autoimmune disease. This means that your body's defense system (immune system) mistakenly attacks healthy body tissues. Follow these instructions at home: Medicines  Take medicines only as told by your doctor.  Do not drive or use heavy machinery while taking prescription pain medicine.  If you are taking prescription pain medicine, take actions to help prevent or treat trouble pooping (constipation). Your doctor may recommend that you: ? Drink enough fluid to keep your pee (urine) pale yellow. ? Eat foods that are high in fiber. These include fresh fruits and vegetables, whole grains, and beans. ? Avoid eating fatty or sweet foods. ? Take a medicine for constipation. If you have a brace:  Wear the brace as told by your doctor. Remove it only as told by your doctor.  Loosen the brace if your toes tingle, become numb, or turn cold and blue.  Keep the brace clean.  If the brace is not waterproof: ? Do not let it get wet. ? Cover it with a watertight covering when you take a bath or a shower. Managing pain, stiffness, and swelling   If told, apply ice to the swollen area: ? If you have a removable brace, remove it as told by your doctor. ? Put ice in a plastic bag. ? Place a towel between your skin and the bag. ? Leave the ice on for 20 minutes, 2-3 times per day.  Keep your knee raised (elevated) when you are sitting or lying down. General instructions  Do not use any products that contain nicotine or tobacco, such as cigarettes and e-cigarettes. These can delay healing. If you need help quitting, ask your doctor.  Use crutches as told by your doctor.  Do exercises as told by your doctor.  Rest as told by your doctor.  Keep all follow-up visits as  told by your doctor. This is important. Contact a doctor if you:  Continue to have pain in your knee. Get help right away if you:  Have swelling or redness of your knee that gets worse or does not get better.  Have serious pain in your knee.  Have a fever. Summary  Knee effusion is when you have extra fluid in your knee. This causes pain and swelling and makes it hard to bend and move your knee.  Take medicines only as told by your doctor.  If you have a brace, wear the brace as told by your doctor. This information is not intended to replace advice given to you by your health care provider. Make sure you discuss any questions you have with your health care provider. Document Revised: 11/21/2017 Document Reviewed: 11/20/2017 Elsevier Patient Education  2020 Elsevier Inc.  

## 2020-08-25 NOTE — Progress Notes (Signed)
Subjective:    Patient ID: Elaine Montgomery, female    DOB: 08/14/1996, 24 y.o.   MRN: [id number]  No chief complaint on file.   HPI Patient was seen today for ongoing concern.  Pt endorses right knee pain x1-2 months.  Patient states pain began after someone fell on her knee causing a tight hip extension.  Lateral right knee hurts/problems all the time and feels like it may give out.  Patient endorses feeling a grinding/internal sensation with moving a certain way.  Also endorses right knee edema.  Pt has not taken anything for her symptoms.  Pt requesting STI testing.  States had recent sex with a partner who later stated they were HIV positive but undetectable.    Past Medical History:  Diagnosis Date  . Headache     No Known Allergies  ROS General: Denies fever, chills, night sweats, changes in weight, changes in appetite HEENT: Denies headaches, ear pain, changes in vision, rhinorrhea, sore throat CV: Denies CP, palpitations, SOB, orthopnea Pulm: Denies SOB, cough, wheezing GI: Denies abdominal pain, nausea, vomiting, diarrhea, constipation GU: Denies dysuria, hematuria, frequency, vaginal discharge Msk: Denies muscle cramps, joint pains  +R knee pain and edema Neuro: Denies weakness, numbness, tingling Skin: Denies rashes, bruising Psych: Denies depression, anxiety, hallucinations      Objective:    Blood pressure 98/68, pulse 64, temperature 98.5 F (36.9 C), temperature source Oral, weight 106 lb 12.8 oz (48.4 kg), SpO2 98 %.   Gen. Pleasant, well-nourished, in no distress, normal affect  HEENT: [place]/AT, face symmetric, conjunctiva clear, no scleral icterus, PERRLA, EOMI, nares patent without drainage Lungs: no accessory muscle use Cardiovascular: RRR, no peripheral edema Musculoskeletal: Mild edema and effusion of R knee.  No crepitus of b/l knees.  TTP of R lateral knee inferior to joint line and at medial patella.  Negative anterior drawer.  No cyanosis or  clubbing, normal tone Neuro:  A&Ox3, CN II-XII intact, normal gait Skin:  Warm, no lesions/ rash   Wt Readings from Last 3 Encounters:  10/11/19 114 lb (51.7 kg)  06/29/18 109 lb 9.6 oz (49.7 kg)  05/16/18 108 lb (49 kg)    Lab Results  Component Value Date   WBC 4.6 10/11/2019   HGB 11.8 (L) 10/11/2019   HCT 35.4 (L) 10/11/2019   PLT 320.0 10/11/2019   GLUCOSE 85 10/11/2019   ALT 7 10/11/2019   AST 12 10/11/2019   NA 138 10/11/2019   K 4.0 10/11/2019   CL 104 10/11/2019   CREATININE 1.00 10/11/2019   BUN 11 10/11/2019   CO2 27 10/11/2019   TSH 1.83 10/11/2019    Assessment/Plan:  Routine screening for STI (sexually transmitted infection)  -Discussed safe sex practices -We will obtain blood testing -Recommend repeat HIV testing in 3 months - Plan: HIV antibody (with reflex), RPR, Cervicovaginal ancillary only  Right anterior knee pain  -Discussed possible causes -Discussed supportive care including ice, elevation, compression, NSAIDs, rest -Discussed obtaining imaging and referral to Ortho. - Plan: Ambulatory referral to Orthopedic Surgery, Mobic 7.5 mg  Edema of knee  -discussed possible causes including cartilage tear, ligament injury, fx - Plan: Ambulatory referral to Orthopedic Surgery, Mobic 7.5 mg  F/u prn  Abbe Amsterdam, MD

## 2020-08-26 LAB — CERVICOVAGINAL ANCILLARY ONLY
Bacterial Vaginitis (gardnerella): NEGATIVE
Candida Glabrata: NEGATIVE
Candida Vaginitis: NEGATIVE
Chlamydia: NEGATIVE
Comment: NEGATIVE
Comment: NEGATIVE
Comment: NEGATIVE
Comment: NEGATIVE
Comment: NEGATIVE
Comment: NORMAL
Neisseria Gonorrhea: NEGATIVE
Trichomonas: NEGATIVE

## 2020-08-28 ENCOUNTER — Encounter: Payer: Self-pay | Admitting: Orthopaedic Surgery

## 2020-08-28 ENCOUNTER — Ambulatory Visit: Payer: Self-pay

## 2020-08-28 ENCOUNTER — Ambulatory Visit: Payer: BC Managed Care – PPO | Admitting: Orthopaedic Surgery

## 2020-08-28 VITALS — Ht 64.75 in | Wt 106.0 lb

## 2020-08-28 DIAGNOSIS — M25561 Pain in right knee: Secondary | ICD-10-CM

## 2020-08-28 DIAGNOSIS — G8929 Other chronic pain: Secondary | ICD-10-CM | POA: Diagnosis not present

## 2020-08-28 MED ORDER — DICLOFENAC SODIUM 75 MG PO TBEC: 75.0000 mg | DELAYED_RELEASE_TABLET | Freq: Two times a day (BID) | ORAL | 2 refills | Status: DC

## 2020-08-28 NOTE — Progress Notes (Signed)
Office Visit Note   Patient: Elaine Montgomery           Date of Birth: [birth year]           MRN: [id number] Visit Date: 08/28/2020              Requested by: Deeann Saint, MD [address] [place],  [place] [address] PCP: Deeann Saint, MD   Assessment & Plan: Visit Diagnoses:  1. Chronic pain of right knee     Plan: Impression is right knee contusion. We will start the patient on a regimented course of anti-inflammatories. She will back off of activity. She will follow up with [place] in 4 to 6 weeks if her symptoms have not improved. Call with concerns or questions.  Follow-Up Instructions: Return if symptoms worsen or fail to improve.   Orders:  Orders Placed This Encounter  Procedures  . XR KNEE 3 VIEW RIGHT   Meds ordered this encounter  Medications  . diclofenac (VOLTAREN) 75 MG EC tablet    Sig: Take 1 tablet (75 mg total) by mouth 2 (two) times daily.    Dispense:  60 tablet    Refill:  2      Procedures: No procedures performed   Clinical Data: No additional findings.   Subjective: Chief Complaint  Patient presents with  . Right Knee - Pain    HPI patient is a pleasant 24 year old female who comes in today following an injury to her right knee. Approximately 2 months ago, someone fell landing on the top of her right knee. She has not noticed significant improvement of symptoms since. 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 All of her pain is to the anterior knee. No mechanical symptoms or instability. There are certain ways that she moves which aggravate her symptoms but she is unable to reproduce these. She also has increased pain when flexing her knee too far as well as with driving and cold weather. She has been using heat and Tylenol with mild relief of symptoms. No previous injury to the right knee.  Review of Systems as detailed in HPI. All others reviewed and are negative.   Objective: Vital Signs: Ht 5'  4.75" (1.645 m)   Wt 106 lb (48.1 kg)   BMI 17.78 kg/m   Physical Exam well-developed well-nourished female no acute distress. Alert oriented x3.  Ortho Exam right knee exam shows no effusion. Range of motion 0 to 120 degrees. She does have lateral joint line tenderness as well as tenderness to the lateral patella facet. Ligaments are stable. She is neurovascularly intact distally.  Specialty Comments:  No specialty comments available.  Imaging: XR KNEE 3 VIEW RIGHT  Result Date: 08/28/2020 No acute or structural abnormalities    PMFS History: Patient Active Problem List   Diagnosis Date Noted  . Migraine without aura and without status migrainosus, not intractable 11/12/2014  . Chronic tension-type headache, not intractable 11/12/2014   Past Medical History:  Diagnosis Date  . Headache     Family History  Problem Relation Age of Onset  . High blood pressure Maternal Grandmother        Died at 45  . Alzheimer's disease Maternal Grandfather        Died at 81  . Cancer Paternal Grandfather        Age at time of death unknown  . Breast cancer Paternal Aunt   . Colon cancer Neg Hx   .  Esophageal cancer Neg Hx   . Stomach cancer Neg Hx     Past Surgical History:  Procedure Laterality Date  . WISDOM TOOTH EXTRACTION     Social History   Occupational History  . Not on file  Tobacco Use  . Smoking status: Never Smoker  . Smokeless tobacco: Never Used  Substance and Sexual Activity  . Alcohol use: Never    Alcohol/week: 0.0 standard drinks  . Drug use: Yes    Types: Marijuana    Comment: every day  . Sexual activity: Never    Birth control/protection: None

## 2021-01-18 IMAGING — DX DG WRIST COMPLETE 3+V*L*
4 series · 4 of 4 positions shown · non-contrast
Comparison: None.

CLINICAL DATA: Wrist pain after fall

EXAM:
LEFT WRIST - COMPLETE 3+ VIEW

[wrist pa (1 of 2)]
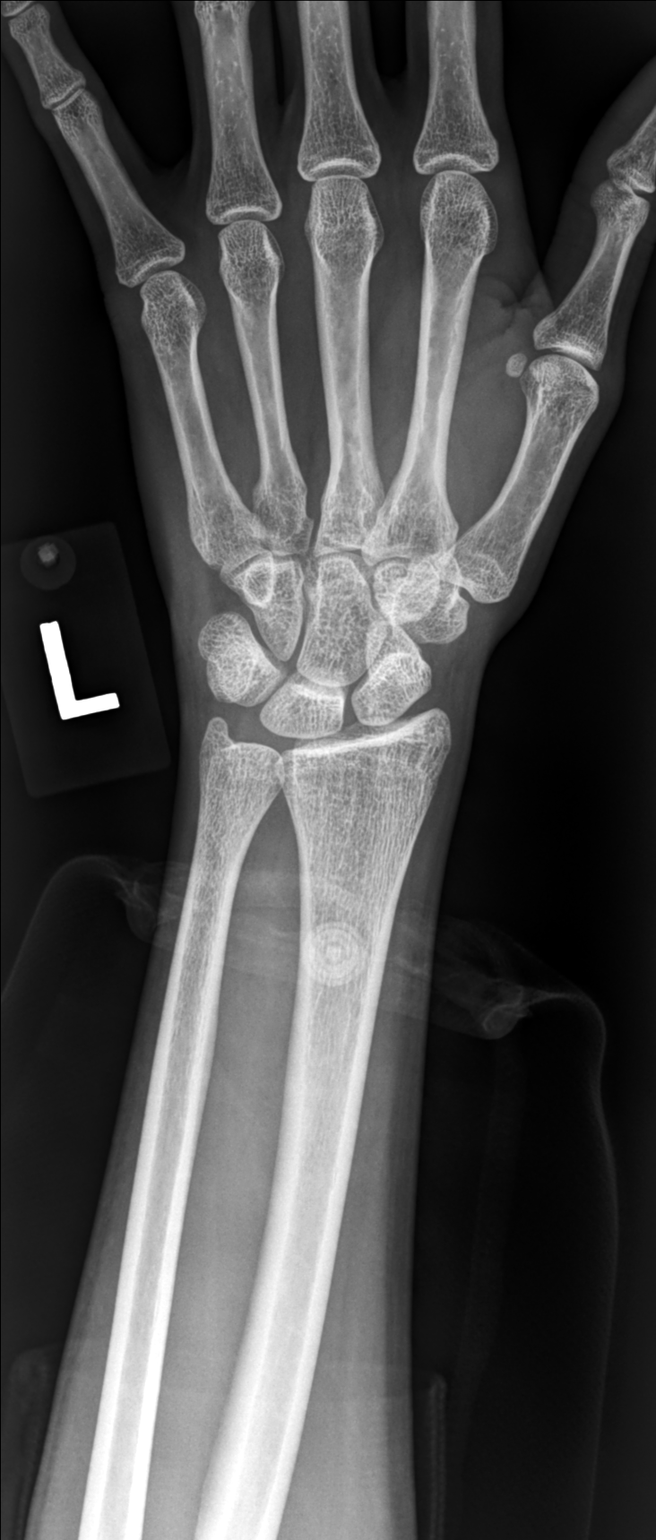

[wrist mlo]
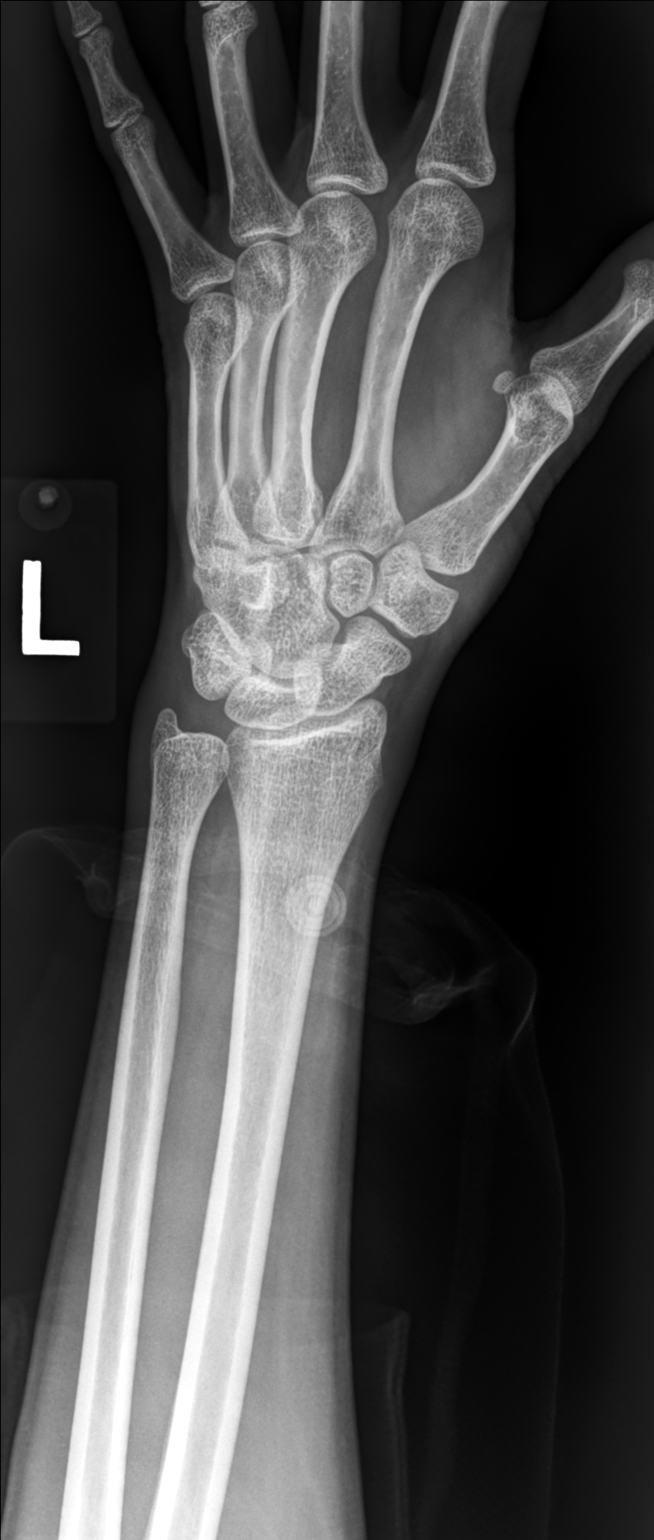

[wrist lat]
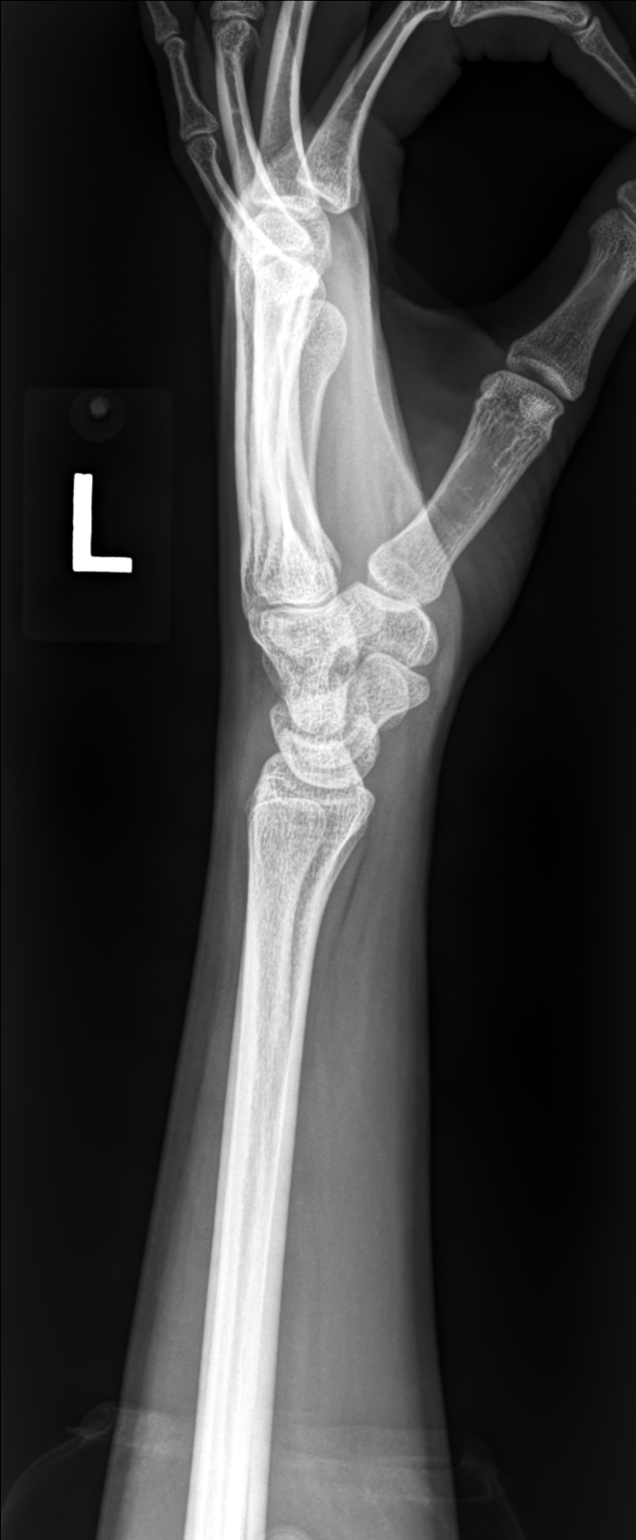

[wrist pa (2 of 2)]
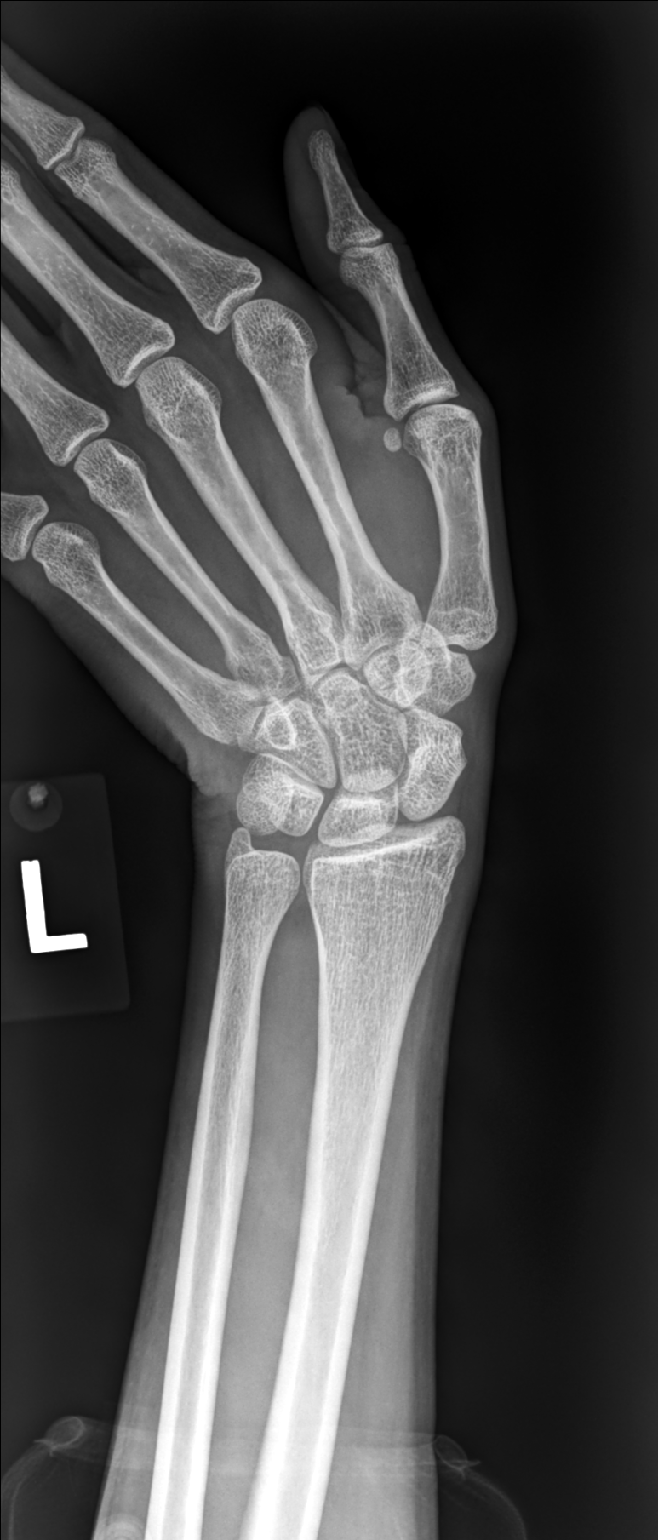

[4 of 4 positions shown; findings below may reference images not displayed]

FINDINGS: There is no evidence of fracture or dislocation. There is no
evidence of arthropathy or other focal bone abnormality. Soft
tissues are unremarkable.
IMPRESSION: Negative.

## 2022-03-31 NOTE — Progress Notes (Addendum)
 Subjective Patient ID: Elaine Montgomery is a 26 y.o. female.  Elaine Montgomery is a 26 y.o. female presents with possible UTI or kidney infection. Patient states she is having pain in her right side and its sharp, frequency and urgency, abdominal pain, lymphs near vagina are swollen. Patient states that she was recently bit by a tick in that area and now its a bump there. Patient found tick after two days of camping, unsure of attachment duration and she does not think it was engorged on removal. Tick was 10 days ago. Symptoms started 7 days ago. Started AZO 2-3 days ago, pain started today. Denies std concerns, just got checked by gynecologist.   History provided by:  Patient Language interpreter used: No   UTI Associated symptoms: abdominal pain   Associated symptoms: no fever     Review of Systems  Constitutional:  Negative for fever.  Gastrointestinal:  Positive for abdominal pain.  Genitourinary:  Positive for dysuria, flank pain, frequency and urgency. Negative for vaginal discharge and vaginal pain.       Patient ending cycle now.   Musculoskeletal:  Negative for back pain.    Patient History  Allergies: No Known Allergies   History reviewed. No pertinent past medical history. History reviewed. No pertinent surgical history. Social History   Socioeconomic History  . Marital status: Single    Spouse name: Not on file  . Number of children: Not on file  . Years of education: Not on file  . Highest education level: Not on file  Occupational History  . Not on file  Tobacco Use  . Smoking status: Never  . Smokeless tobacco: Never  Vaping Use  . Vaping Use: Every day  Substance and Sexual Activity  . Alcohol use: Not on file  . Drug use: Not on file  . Sexual activity: Not on file  Other Topics Concern  . Not on file  Social History Narrative  . Not on file   History reviewed. No pertinent family history. Current Outpatient Medications on File Prior to Visit   Medication Sig Dispense Refill  . cefdinir (Omnicef) 300 MG capsule cefdinir 300 mg capsule    . etonogestrel-eluting contraceptive device (Nexplanon) 68 MG implant Nexplanon 68 mg subdermal implant  Inject 1 implant by subcutaneous route.    SABRA HYDROcodone-acetaminophen (Norco) 7.5-325 MG tablet hydrocodone 7.5 mg-acetaminophen 325 mg tablet    . ketorolac (Toradol) 10 MG tablet ketorolac 10 mg tablet    . metroNIDAZOLE (Flagyl) 500 MG tablet metronidazole 500 mg tablet    . Sodium Fluoride 1.1 % cream Denta 5000 Plus 1.1 % cream     No current facility-administered medications on file prior to visit.     Objective  Vitals:   03/31/22 1002  BP: 120/79  Pulse: 76  Resp: 18  Temp: 37 C (98.6 F)  SpO2: 99%  Weight: 56.9 kg  Height: 5' 4  LMP: 03/12/2022     LMP date is exact.  OBGYN/Pregnancy Status: Implant           No results found.  Physical Exam Vitals and nursing note reviewed.  Constitutional:      General: She is not in acute distress.    Appearance: Normal appearance.  HENT:     Head: Normocephalic.     Nose: Nose normal.     Mouth/Throat:     Mouth: Mucous membranes are moist.  Eyes:     Extraocular Movements: Extraocular movements intact.     Conjunctiva/sclera:  Conjunctivae normal.  Cardiovascular:     Rate and Rhythm: Normal rate.  Pulmonary:     Effort: Pulmonary effort is normal.  Abdominal:     General: There is no distension.  Musculoskeletal:        General: Normal range of motion.     Cervical back: Normal range of motion and neck supple. No tenderness.  Lymphadenopathy:     Cervical: No cervical adenopathy.  Skin:    General: Skin is warm.  Neurological:     Mental Status: She is alert and oriented to person, place, and time.  Psychiatric:        Mood and Affect: Mood normal.        Behavior: Behavior normal.      Results for orders placed or performed in visit on 03/31/22  POCT urinalysis dipstick manually resulted   Component Result   Color, UA Yellow   Clarity, UA Slightly Cloudy (A)   Glucose, UA Negative   Bilirubin, UA Negative   Ketones, UA Negative   Spec Grav, UA 1.020   Blood, UA 3+ (A)   pH, UA 6.0   Protein, UA 1+ (A)   Urobilinogen, UA Negative   Leukocytes, UA Trace (A)   Nitrite, UA Negative  POCT pregnancy, urine manually resulted  Component Result   Preg Test, Ur Negative   Internal Quality Control Pass     Procedures MDM:     1 Acute complicated illness or injury     Explanation of Medical Decision Making and variances from expected care:  Complicated uti, concern for kidney infection. Discussed lyme and need of tick adherence for 24 hours. Patient symptoms consistent with UTI. Patient declined empiric lyme treatment. Advised PCM follow up.   Unique ordered tests: One     Review of any test results: Two     Assessment requiring historian other than patient: No     Independent visualization of image, tracing, or test: No     Discussion of management with another provider: No     Risk:: Moderate          Assessment/Plan Diagnoses and all orders for this visit: UTI (urinary tract infection) -     ibuprofen 800 MG tablet; Take 1 tablet (800 mg total) by mouth every 8 (eight) hours if needed for mild pain or moderate pain for up to 10 days. -     ciprofloxacin (Cipro) 500 MG tablet; Take 1 tablet (500 mg total) by mouth in the morning and 1 tablet (500 mg total) in the evening. Do all this for 7 days. -     Urine culture Other orders -     POCT urinalysis dipstick manually resulted -     POCT pregnancy, urine manually resulted     Disposition Status: Home  Progress note signed by Debby Rung, PA on 03/31/22 at  2:14 PM

## 2022-04-02 ENCOUNTER — Ambulatory Visit (HOSPITAL_COMMUNITY)
Admission: EM | Admit: 2022-04-02 | Discharge: 2022-04-02 | Disposition: A | Discharge status: Home / Self Care | Payer: BC Managed Care – PPO | Attending: Internal Medicine | Admitting: Internal Medicine

## 2022-04-02 DIAGNOSIS — N10 Acute tubulo-interstitial nephritis: Secondary | ICD-10-CM | POA: Diagnosis not present

## 2022-04-02 LAB — CBC WITH DIFFERENTIAL/PLATELET
Abs Immature Granulocytes: 0.04 10*3/uL (ref 0.00–0.07)
Basophils Absolute: 0 10*3/uL (ref 0.0–0.1)
Basophils Relative: 0 %
Eosinophils Absolute: 0 10*3/uL (ref 0.0–0.5)
Eosinophils Relative: 1 %
HCT: 36.8 % (ref 36.0–46.0)
Hemoglobin: 12.8 g/dL (ref 12.0–15.0)
Immature Granulocytes: 1 %
Lymphocytes Relative: 12 %
Lymphs Abs: 1 10*3/uL (ref 0.7–4.0)
MCH: 32.9 pg (ref 26.0–34.0)
MCHC: 34.8 g/dL (ref 30.0–36.0)
MCV: 94.6 fL (ref 80.0–100.0)
Monocytes Absolute: 0.5 10*3/uL (ref 0.1–1.0)
Monocytes Relative: 5 %
Neutro Abs: 6.9 10*3/uL (ref 1.7–7.7)
Neutrophils Relative %: 81 %
Platelets: 310 10*3/uL (ref 150–400)
RBC: 3.89 MIL/uL (ref 3.87–5.11)
RDW: 13.2 % (ref 11.5–15.5)
WBC: 8.5 10*3/uL (ref 4.0–10.5)
nRBC: 0 % (ref 0.0–0.2)

## 2022-04-02 LAB — POCT URINALYSIS DIPSTICK, ED / UC
Bilirubin Urine: NEGATIVE
Glucose, UA: NEGATIVE mg/dL
Hgb urine dipstick: NEGATIVE
Ketones, ur: 15 mg/dL — AB
Leukocytes,Ua: NEGATIVE
Nitrite: NEGATIVE
Protein, ur: 100 mg/dL — AB
Specific Gravity, Urine: 1.01 (ref 1.005–1.030)
Urobilinogen, UA: 0.2 mg/dL (ref 0.0–1.0)
pH: 6 (ref 5.0–8.0)

## 2022-04-02 LAB — BASIC METABOLIC PANEL
Anion gap: 6 (ref 5–15)
BUN: 15 mg/dL (ref 6–20)
CO2: 25 mmol/L (ref 22–32)
Calcium: 9.6 mg/dL (ref 8.9–10.3)
Chloride: 109 mmol/L (ref 98–111)
Creatinine, Ser: 1.74 mg/dL — ABNORMAL HIGH (ref 0.44–1.00)
GFR, Estimated: 42 mL/min — ABNORMAL LOW (ref >60)
Glucose, Bld: 110 mg/dL — ABNORMAL HIGH (ref 70–99)
Potassium: 4.4 mmol/L (ref 3.5–5.1)
Sodium: 140 mmol/L (ref 135–145)

## 2022-04-02 MED ORDER — ONDANSETRON 4 MG PO TBDP
ORAL_TABLET | ORAL | Status: AC
  Filled 2022-04-02: qty 1

## 2022-04-02 MED ORDER — ONDANSETRON 4 MG PO TBDP: 4.0000 mg | ORAL_TABLET | Freq: Three times a day (TID) | ORAL | 0 refills | Status: DC | PRN

## 2022-04-02 MED ORDER — ONDANSETRON 4 MG PO TBDP
4.0000 mg | ORAL_TABLET | Freq: Once | ORAL | Status: AC
  Administered 2022-04-02: 4 mg via ORAL

## 2022-04-02 NOTE — ED Triage Notes (Signed)
Diagnosis with a UTI 3 days ago, since then has been vomiting a total of 8 times.  ?Pt reports having a tick bite in her groin area and would like to have it check out.  ?

## 2022-04-02 NOTE — Discharge Instructions (Addendum)
Please increase oral fluid intake ?Take medications as prescribed ?Tylenol/Motrin as needed for pain and/or fever. ?If symptoms worsen please return to urgent care to be evaluated. ?

## 2022-04-04 NOTE — ED Provider Notes (Signed)
?[facility] ? ? ? ?CSN: [id number] ?Arrival date & time: 04/02/22  1051 ? ? ?  ? ?History   ?Chief Complaint ?Chief Complaint  ?Patient presents with  ? Urinary Tract Infection  ? Emesis  ? ? ?HPI ?Elaine Montgomery is a 26 y.o. female comes to the urgent care with persistent vomiting which started 3 days ago.  Patient was diagnosed with acute pyelonephritis and prescribed ciprofloxacin.  Patient experienced nonbilious nonbloody vomiting after she started taking the antibiotics.  She denies any abdominal pain or distention.  No flank pain.  Dysuria is better.  No febrile episodes.  Patient comes to the urgent care to be evaluated for the nausea and vomiting.  ? ?HPI ? ?Past Medical History:  ?Diagnosis Date  ? Headache   ? ? ?Patient Active Problem List  ? Diagnosis Date Noted  ? Migraine without aura and without status migrainosus, not intractable 11/12/2014  ? Chronic tension-type headache, not intractable 11/12/2014  ? ? ?Past Surgical History:  ?Procedure Laterality Date  ? WISDOM TOOTH EXTRACTION    ? ? ?OB History   ?No obstetric history on file. ?  ? ? ? ?Home Medications   ? ?Prior to Admission medications   ?Medication Sig Start Date End Date Taking? Authorizing Provider  ?ondansetron (ZOFRAN-ODT) 4 MG disintegrating tablet Take 1 tablet (4 mg total) by mouth every 8 (eight) hours as needed for nausea or vomiting. 04/02/22  Yes Kristine Tiley, Britta Mccreedy, MD  ?ciprofloxacin (CIPRO) 500 MG tablet Take 500 mg by mouth 2 (two) times daily. 03/31/22   [provider]  ?diclofenac (VOLTAREN) 75 MG EC tablet Take 1 tablet (75 mg total) by mouth 2 (two) times daily. 08/28/20   Cristie Hem, PA-C  ?meloxicam (MOBIC) 7.5 MG tablet Take 1 tablet (7.5 mg total) by mouth daily. 08/25/20   Deeann Saint, MD  ? ? ?Family History ?Family History  ?Problem Relation Age of Onset  ? High blood pressure Maternal Grandmother   ?     Died at 20  ? Alzheimer's disease Maternal Grandfather   ?     Died at 70  ?  Cancer Paternal Grandfather   ?     Age at time of death unknown  ? Breast cancer Paternal Aunt   ? Colon cancer Neg Hx   ? Esophageal cancer Neg Hx   ? Stomach cancer Neg Hx   ? ? ?Social History ?Social History  ? ?Tobacco Use  ? Smoking status: Never  ? Smokeless tobacco: Never  ?Substance Use Topics  ? Alcohol use: Never  ?  Alcohol/week: 0.0 standard drinks  ? Drug use: Yes  ?  Types: Marijuana  ?  Comment: every day  ? ? ? ?Allergies   ?Patient has no known allergies. ? ? ?Review of Systems ?Review of Systems  ?Constitutional: Negative.   ?Gastrointestinal:  Positive for nausea and vomiting. Negative for abdominal pain.  ?Musculoskeletal: Negative.   ?Neurological: Negative.   ? ? ?Physical Exam ?Triage Vital Signs ?ED Triage Vitals  ?Enc Vitals Group  ?   BP 04/02/22 1108 (!) 123/55  ?   Pulse Rate 04/02/22 1108 100  ?   Resp 04/02/22 1108 18  ?   Temp 04/02/22 1111 97.8 ?F (36.6 ?C)  ?   Temp Source 04/02/22 1111 Oral  ?   SpO2 04/02/22 1108 100 %  ?   Weight --   ?   Height --   ?  Head Circumference --   ?   Peak Flow --   ?   Pain Score 04/02/22 1152 0  ?   Pain Loc --   ?   Pain Edu? --   ?   Excl. in GC? --   ? ?No data found. ? ?Updated Vital Signs ?BP (!) 123/55 (BP Location: Right Arm)   Pulse 100   Temp 97.8 ?F (36.6 ?C) (Oral)   Resp 18   SpO2 100%  ? ?Visual Acuity ?Right Eye Distance:   ?Left Eye Distance:   ?Bilateral Distance:   ? ?Right Eye Near:   ?Left Eye Near:    ?Bilateral Near:    ? ?Physical Exam ?Vitals and nursing note reviewed.  ?Constitutional:   ?   General: She is not in acute distress. ?   Appearance: Normal appearance. She is not ill-appearing.  ?Cardiovascular:  ?   Rate and Rhythm: Normal rate and regular rhythm.  ?   Pulses: Normal pulses.  ?   Heart sounds: Normal heart sounds.  ?Abdominal:  ?   General: Bowel sounds are normal.  ?   Tenderness: There is no right CVA tenderness or left CVA tenderness.  ?Neurological:  ?   Mental Status: She is alert.  ? ? ? ?UC  Treatments / Results  ?Labs ?(all labs ordered are listed, but only abnormal results are displayed) ?Labs Reviewed  ?BASIC METABOLIC PANEL - Abnormal; Notable for the following components:  ?    Result Value  ? Glucose, Bld 110 (*)   ? Creatinine, Ser 1.74 (*)   ? GFR, Estimated 42 (*)   ? All other components within normal limits  ?POCT URINALYSIS DIPSTICK, ED / UC - Abnormal; Notable for the following components:  ? Ketones, ur 15 (*)   ? Protein, ur 100 (*)   ? All other components within normal limits  ?CBC WITH DIFFERENTIAL/PLATELET  ?POCT URINALYSIS DIPSTICK, ED / UC  ? ? ?EKG ? ? ?Radiology ?No results found. ? ?Procedures ?Procedures (including critical care time) ? ?Medications Ordered in UC ?Medications  ?ondansetron (ZOFRAN-ODT) disintegrating tablet 4 mg (4 mg Oral Given 04/02/22 1136)  ? ? ?Initial Impression / Assessment and Plan / UC Course  ?I have reviewed the triage vital signs and the nursing notes. ? ?Pertinent labs & imaging results that were available during my care of the patient were reviewed by me and considered in my medical decision making (see chart for details). ? ?  ? ?1.  Acute pyelonephritis with persistent vomiting ?Zofran ODT 4 mg x 1 dose ?CBC, BMP ?Point-of-care urinalysis ?Patient advised to increase fluid intake ?Zofran ODT as needed for nausea/vomiting ?Patient is encouraged to complete oral antibiotic ?At this time, patient's symptoms are improving except nausea and vomiting ?Return precautions given. ?Final Clinical Impressions(s) / UC Diagnoses  ? ?Final diagnoses:  ?Acute pyelonephritis  ? ? ? ?Discharge Instructions   ? ?  ?Please increase oral fluid intake ?Take medications as prescribed ?Tylenol/Motrin as needed for pain and/or fever. ?If symptoms worsen please return to urgent care to be evaluated. ? ? ? ?ED Prescriptions   ? ? Medication Sig Dispense Auth. Provider  ? ondansetron (ZOFRAN-ODT) 4 MG disintegrating tablet Take 1 tablet (4 mg total) by mouth every 8 (eight)  hours as needed for nausea or vomiting. 20 tablet Laurie Penado, Britta Mccreedy, MD  ? ?  ? ?PDMP not reviewed this encounter. ?  ?Merrilee Jansky, MD ?04/04/22 1023 ? ?

## 2022-10-29 DIAGNOSIS — Z681 Body mass index (BMI) 19 or less, adult: Secondary | ICD-10-CM | POA: Diagnosis not present

## 2022-10-29 DIAGNOSIS — Z202 Contact with and (suspected) exposure to infections with a predominantly sexual mode of transmission: Secondary | ICD-10-CM | POA: Diagnosis not present

## 2022-10-29 DIAGNOSIS — Z708 Other sex counseling: Secondary | ICD-10-CM | POA: Diagnosis not present

## 2022-10-29 DIAGNOSIS — Z717 Human immunodeficiency virus [HIV] counseling: Secondary | ICD-10-CM | POA: Diagnosis not present

## 2022-11-03 DIAGNOSIS — N898 Other specified noninflammatory disorders of vagina: Secondary | ICD-10-CM | POA: Diagnosis not present

## 2022-11-03 DIAGNOSIS — R7989 Other specified abnormal findings of blood chemistry: Secondary | ICD-10-CM | POA: Diagnosis not present

## 2022-11-03 DIAGNOSIS — N760 Acute vaginitis: Secondary | ICD-10-CM | POA: Diagnosis not present

## 2022-11-03 DIAGNOSIS — Z3046 Encounter for surveillance of implantable subdermal contraceptive: Secondary | ICD-10-CM | POA: Diagnosis not present

## 2022-11-03 DIAGNOSIS — Z113 Encounter for screening for infections with a predominantly sexual mode of transmission: Secondary | ICD-10-CM | POA: Diagnosis not present

## 2022-11-04 LAB — BASIC METABOLIC PANEL
BUN: 9 (ref 4–21)
CO2: 23 — AB (ref 13–22)
Chloride: 103 (ref 99–108)
Creatinine: 1 (ref 0.5–1.1)
Glucose: 88
Potassium: 4.8 mEq/L (ref 3.5–5.1)
Sodium: 140 (ref 137–147)

## 2022-11-04 LAB — COMPREHENSIVE METABOLIC PANEL
Calcium: 10.1 (ref 8.7–10.7)
eGFR: 79

## 2022-11-23 ENCOUNTER — Other Ambulatory Visit: Payer: Self-pay | Admitting: Family Medicine

## 2023-01-21 DIAGNOSIS — Z113 Encounter for screening for infections with a predominantly sexual mode of transmission: Secondary | ICD-10-CM | POA: Diagnosis not present

## 2023-01-21 DIAGNOSIS — Z3046 Encounter for surveillance of implantable subdermal contraceptive: Secondary | ICD-10-CM | POA: Diagnosis not present

## 2023-04-13 ENCOUNTER — Encounter: Payer: Self-pay | Admitting: Family Medicine

## 2023-04-13 ENCOUNTER — Ambulatory Visit (INDEPENDENT_AMBULATORY_CARE_PROVIDER_SITE_OTHER): Payer: Medicaid Other | Admitting: Family Medicine

## 2023-04-13 VITALS — BP 98/68 | HR 62 | Temp 98.6°F | Ht 64.73 in | Wt 109.2 lb

## 2023-04-13 DIAGNOSIS — H65193 Other acute nonsuppurative otitis media, bilateral: Secondary | ICD-10-CM | POA: Diagnosis not present

## 2023-04-13 DIAGNOSIS — H6993 Unspecified Eustachian tube disorder, bilateral: Secondary | ICD-10-CM

## 2023-04-13 DIAGNOSIS — Z113 Encounter for screening for infections with a predominantly sexual mode of transmission: Secondary | ICD-10-CM

## 2023-04-13 DIAGNOSIS — N920 Excessive and frequent menstruation with regular cycle: Secondary | ICD-10-CM | POA: Diagnosis not present

## 2023-04-13 DIAGNOSIS — J3089 Other allergic rhinitis: Secondary | ICD-10-CM

## 2023-04-13 DIAGNOSIS — Z0001 Encounter for general adult medical examination with abnormal findings: Secondary | ICD-10-CM

## 2023-04-13 DIAGNOSIS — Z0000 Encounter for general adult medical examination without abnormal findings: Secondary | ICD-10-CM

## 2023-04-13 LAB — COMPREHENSIVE METABOLIC PANEL
ALT: 12 U/L (ref 0–35)
AST: 19 U/L (ref 0–37)
Albumin: 5 g/dL (ref 3.5–5.2)
Alkaline Phosphatase: 55 U/L (ref 39–117)
BUN: 10 mg/dL (ref 6–23)
CO2: 26 mEq/L (ref 19–32)
Calcium: 9.9 mg/dL (ref 8.4–10.5)
Chloride: 102 mEq/L (ref 96–112)
Creatinine, Ser: 0.98 mg/dL (ref 0.40–1.20)
GFR: 80.48 mL/min (ref >60.00)
Glucose, Bld: 89 mg/dL (ref 70–99)
Potassium: 4.1 mEq/L (ref 3.5–5.1)
Sodium: 137 mEq/L (ref 135–145)
Total Bilirubin: 0.5 mg/dL (ref 0.2–1.2)
Total Protein: 8.2 g/dL (ref 6.0–8.3)

## 2023-04-13 LAB — LIPID PANEL
Cholesterol: 147 mg/dL (ref 0–200)
HDL: 62.3 mg/dL (ref >39.00)
LDL Cholesterol: 70 mg/dL (ref 0–99)
NonHDL: 85.16
Total CHOL/HDL Ratio: 2
Triglycerides: 78 mg/dL (ref 0.0–149.0)
VLDL: 15.6 mg/dL (ref 0.0–40.0)

## 2023-04-13 LAB — CBC WITH DIFFERENTIAL/PLATELET
Basophils Absolute: 0 10*3/uL (ref 0.0–0.1)
Basophils Relative: 0.5 % (ref 0.0–3.0)
Eosinophils Absolute: 0.3 10*3/uL (ref 0.0–0.7)
Eosinophils Relative: 8.4 % — ABNORMAL HIGH (ref 0.0–5.0)
HCT: 40.8 % (ref 36.0–46.0)
Hemoglobin: 13.7 g/dL (ref 12.0–15.0)
Lymphocytes Relative: 46.7 % — ABNORMAL HIGH (ref 12.0–46.0)
Lymphs Abs: 1.6 10*3/uL (ref 0.7–4.0)
MCHC: 33.5 g/dL (ref 30.0–36.0)
MCV: 95.8 fl (ref 78.0–100.0)
Monocytes Absolute: 0.4 10*3/uL (ref 0.1–1.0)
Monocytes Relative: 12.6 % — ABNORMAL HIGH (ref 3.0–12.0)
Neutro Abs: 1.1 10*3/uL — ABNORMAL LOW (ref 1.4–7.7)
Neutrophils Relative %: 31.8 % — ABNORMAL LOW (ref 43.0–77.0)
Platelets: 280 10*3/uL (ref 150.0–400.0)
RBC: 4.26 Mil/uL (ref 3.87–5.11)
RDW: 14.1 % (ref 11.5–15.5)
WBC: 3.5 10*3/uL — ABNORMAL LOW (ref 4.0–10.5)

## 2023-04-13 LAB — T4, FREE: Free T4: 0.89 ng/dL (ref 0.60–1.60)

## 2023-04-13 LAB — HEMOGLOBIN A1C: Hgb A1c MFr Bld: 5.3 % (ref 4.6–6.5)

## 2023-04-13 LAB — TSH: TSH: 2.14 u[IU]/mL (ref 0.35–5.50)

## 2023-04-13 NOTE — Progress Notes (Signed)
Established Patient Office Visit   Subjective  Patient ID: Elaine Montgomery, female    DOB: 1996-11-04  Age: 27 y.o. MRN: [id number]  Chief Complaint  Patient presents with   Annual Exam    Wants to check hemo and iron.     Patient is a 27 year old female who presents for CPE and sick visit.  Patient states she has been doing well overall.  Went camping over the weekend.  Noticed SOB, nasal congestion/edema, muffled sound in ears, postnasal drainage, occasional cough.  Patient states it rained at night.  Other people who went camping are not sick.  Patient was around a little girl in daycare who was sick recently.  Patient tried Claritin for symptoms but did not notice much difference.  Patient followed by OB/GYN.  Pap scheduled for July.  Patient recently had Nexplanon removed.  Notes heavy irregular cycles.  Concern for anemia as feeling fatigued and weak during cycles.      ROS Negative unless stated above    Objective:     BP 98/68 (BP Location: Left Arm, Patient Position: Sitting, Cuff Size: Normal)   Pulse 62   Temp 98.6 F (37 C) (Oral)   Ht 5' 4.73" (1.644 m)   Wt 109 lb 3.2 oz (49.5 kg)   LMP 03/22/2023   SpO2 98%   BMI 18.33 kg/m    Physical Exam Constitutional:      Appearance: Normal appearance.  HENT:     Head: Normocephalic and atraumatic.     Right Ear: Tympanic membrane, ear canal and external ear normal.     Left Ear: Tympanic membrane, ear canal and external ear normal.     Ears:     Comments: Bilateral TMs full with effusion.    Nose: Nose normal.     Right Turbinates: Enlarged and swollen.     Left Turbinates: Enlarged and swollen.     Mouth/Throat:     Mouth: Mucous membranes are moist.     Pharynx: No oropharyngeal exudate or posterior oropharyngeal erythema.     Comments: Clear postnasal drainage Eyes:     General: No scleral icterus.    Extraocular Movements: Extraocular movements intact.     Conjunctiva/sclera: Conjunctivae normal.      Pupils: Pupils are equal, round, and reactive to light.  Neck:     Thyroid: No thyromegaly.  Cardiovascular:     Rate and Rhythm: Normal rate and regular rhythm.     Pulses: Normal pulses.     Heart sounds: Normal heart sounds. No murmur heard.    No friction rub.  Pulmonary:     Effort: Pulmonary effort is normal.     Breath sounds: Normal breath sounds. No wheezing, rhonchi or rales.  Abdominal:     General: Bowel sounds are normal.     Palpations: Abdomen is soft.     Tenderness: There is no abdominal tenderness.  Musculoskeletal:        General: No deformity. Normal range of motion.  Lymphadenopathy:     Cervical: No cervical adenopathy.  Skin:    General: Skin is warm and dry.     Findings: No lesion.  Neurological:     General: No focal deficit present.     Mental Status: She is alert and oriented to person, place, and time.  Psychiatric:        Mood and Affect: Mood normal.        Thought Content: Thought content normal.  No results found for any visits on 04/13/23.    Assessment & Plan:  Well adult exam -Age-appropriate screenings discussed -Pap schedule in July with OB/GYN -Immunizations reviewed -Next CPE in 1 year -     Hemoglobin A1c -     Lipid panel -     Comprehensive metabolic panel  Dysfunction of both eustachian tubes -OTC antihistamine -     CBC with Differential/Platelet  Non-seasonal allergic rhinitis due to fungal spores -OTC antihistamine -     CBC with Differential/Platelet  Routine screening for STI (sexually transmitted infection) -     HIV Antibody (routine testing w rflx) -     RPR -     C. trachomatis/N. gonorrhoeae RNA  Menorrhagia with regular cycle -Evaluate for anemia -     CBC with Differential/Platelet -     TSH -     T4, free -     Iron, TIBC and Ferritin Panel  Acute effusion of both middle ears -Start different OTC antihistamine, steam from shower, saline nasal rinse or Flonase. -Monitor for signs of ear  infection  Return if symptoms worsen or fail to improve.   Deeann Saint, MD

## 2023-04-14 LAB — HIV ANTIBODY (ROUTINE TESTING W REFLEX): HIV 1&2 Ab, 4th Generation: NONREACTIVE

## 2023-04-14 LAB — IRON,TIBC AND FERRITIN PANEL
%SAT: 18 % (calc) (ref 16–45)
Ferritin: 38 ng/mL (ref 16–154)
Iron: 58 ug/dL (ref 40–190)
TIBC: 327 mcg/dL (calc) (ref 250–450)

## 2023-04-14 LAB — C. TRACHOMATIS/N. GONORRHOEAE RNA
C. trachomatis RNA, TMA: NOT DETECTED
N. gonorrhoeae RNA, TMA: NOT DETECTED

## 2023-04-14 LAB — RPR: RPR Ser Ql: NONREACTIVE

## 2023-04-20 ENCOUNTER — Ambulatory Visit: Payer: Medicaid Other | Admitting: Family Medicine

## 2023-04-20 VITALS — BP 118/72 | HR 82 | Temp 99.2°F | Wt 109.6 lb

## 2023-04-20 DIAGNOSIS — J029 Acute pharyngitis, unspecified: Secondary | ICD-10-CM | POA: Diagnosis not present

## 2023-04-20 DIAGNOSIS — R0981 Nasal congestion: Secondary | ICD-10-CM

## 2023-04-20 DIAGNOSIS — H6993 Unspecified Eustachian tube disorder, bilateral: Secondary | ICD-10-CM | POA: Diagnosis not present

## 2023-04-20 LAB — POCT RAPID STREP A (OFFICE): Rapid Strep A Screen: NEGATIVE

## 2023-04-20 MED ORDER — AMOXICILLIN-POT CLAVULANATE 500-125 MG PO TABS: 1.0000 | ORAL_TABLET | Freq: Two times a day (BID) | ORAL | 0 refills | Status: AC

## 2023-04-20 NOTE — Progress Notes (Signed)
Established Patient Office Visit   Subjective  Patient ID: Elaine Montgomery, female    DOB: [birth year]  Age: 27 y.o. MRN: [id number]  Chief Complaint  Patient presents with   Follow-up    Throat still hurts, feels swollen and tight when turns neck. Mucus is now green, feels like she is gasping for air. Tried hot teas, warm salt water gargles, neither helped. Tried the vicks inhaler and was able to breathe from right nostrile.     Patient is a 27 year old female who presents for follow-up.  Seen 5/22 for eustachian tube dysfunction/allergies symptoms.  On worsening pt endorses increased sore throat, nasal congestion right side greater than left.  Also with R preauricular discomfort.  Patient endorses dysphagia, sensation of gasping, green mucus.  Patient tried antihistamine    Past Medical History:  Diagnosis Date   Headache    Past Surgical History:  Procedure Laterality Date   WISDOM TOOTH EXTRACTION     Social History   Tobacco Use   Smoking status: Never   Smokeless tobacco: Never  Substance Use Topics   Alcohol use: Never    Alcohol/week: 0.0 standard drinks of alcohol   Drug use: Yes    Types: Marijuana    Comment: every day   Family History  Problem Relation Age of Onset   High blood pressure Maternal Grandmother        Died at 60   Alzheimer's disease Maternal Grandfather        Died at 31   Cancer Paternal Grandfather        Age at time of death unknown   Breast cancer Paternal Aunt    Colon cancer Neg Hx    Esophageal cancer Neg Hx    Stomach cancer Neg Hx    No Known Allergies    ROS Negative unless stated above    Objective:     BP 118/72 (BP Location: Right Arm, Patient Position: Sitting, Cuff Size: Normal)   Pulse 82   Temp 99.2 F (37.3 C) (Oral)   Wt 109 lb 9.6 oz (49.7 kg)   LMP 03/22/2023   SpO2 98%   BMI 18.39 kg/m    Physical Exam Constitutional:      General: She is not in acute distress.    Appearance: Normal appearance.   HENT:     Head: Normocephalic and atraumatic.     Ears:     Comments: Bilateral TMs full without effusion or erythema.    Nose: Nose normal.     Right Turbinates: Enlarged and swollen.     Left Turbinates: Enlarged and swollen.     Mouth/Throat:     Mouth: Mucous membranes are moist.     Pharynx: Pharyngeal swelling and posterior oropharyngeal erythema present. No oropharyngeal exudate.     Comments: Tonsils enlarged bilaterally.  Not kissing. Cardiovascular:     Rate and Rhythm: Normal rate and regular rhythm.     Heart sounds: Normal heart sounds. No murmur heard.    No gallop.  Pulmonary:     Effort: Pulmonary effort is normal. No respiratory distress.     Breath sounds: Normal breath sounds. No wheezing, rhonchi or rales.  Skin:    General: Skin is warm and dry.  Neurological:     Mental Status: She is alert and oriented to person, place, and time.      Results for orders placed or performed in visit on 04/20/23  POC Rapid Strep A  Result Value Ref  Range   Rapid Strep A Screen Negative Negative      Assessment & Plan:  Pharyngitis, unspecified etiology -     POCT rapid strep A -     Amoxicillin-Pot Clavulanate; Take 1 tablet by mouth in the morning and at bedtime for 7 days.  Dispense: 14 tablet; Refill: 0  Nasal congestion  Dysfunction of both eustachian tubes  Given continued symptoms concern for strep throat, sinusitis.  Rapid strep negative.  Also consider pharyngeal abscess though no change in voice appreciated.  Start ABX.  Supportive care with over-the-counter Chloraseptic spray or gargling with warm salt water given strict precautions for continued or worsening symptoms.  Return if symptoms worsen or fail to improve.   Deeann Saint, MD

## 2023-04-26 ENCOUNTER — Other Ambulatory Visit: Payer: Self-pay

## 2023-04-26 DIAGNOSIS — D729 Disorder of white blood cells, unspecified: Secondary | ICD-10-CM

## 2024-04-23 ENCOUNTER — Inpatient Hospital Stay (HOSPITAL_COMMUNITY)
Admission: AD | Admit: 2024-04-23 | Discharge: 2024-04-23 | Disposition: A | Discharge status: Home / Self Care | Attending: Obstetrics and Gynecology | Admitting: Obstetrics and Gynecology

## 2024-04-23 ENCOUNTER — Other Ambulatory Visit: Payer: Self-pay

## 2024-04-23 ENCOUNTER — Encounter (HOSPITAL_COMMUNITY): Payer: Self-pay | Admitting: Obstetrics and Gynecology

## 2024-04-23 DIAGNOSIS — O99321 Drug use complicating pregnancy, first trimester: Secondary | ICD-10-CM | POA: Insufficient documentation

## 2024-04-23 DIAGNOSIS — O99611 Diseases of the digestive system complicating pregnancy, first trimester: Secondary | ICD-10-CM | POA: Diagnosis not present

## 2024-04-23 DIAGNOSIS — K2091 Esophagitis, unspecified with bleeding: Secondary | ICD-10-CM | POA: Diagnosis present

## 2024-04-23 DIAGNOSIS — Z3A09 9 weeks gestation of pregnancy: Secondary | ICD-10-CM | POA: Diagnosis not present

## 2024-04-23 DIAGNOSIS — F1290 Cannabis use, unspecified, uncomplicated: Secondary | ICD-10-CM | POA: Insufficient documentation

## 2024-04-23 DIAGNOSIS — O219 Vomiting of pregnancy, unspecified: Secondary | ICD-10-CM

## 2024-04-23 DIAGNOSIS — K2210 Ulcer of esophagus without bleeding: Secondary | ICD-10-CM

## 2024-04-23 LAB — COMPREHENSIVE METABOLIC PANEL WITH GFR
ALT: 14 U/L (ref 0–44)
AST: 22 U/L (ref 15–41)
Albumin: 4.4 g/dL (ref 3.5–5.0)
Alkaline Phosphatase: 32 U/L — ABNORMAL LOW (ref 38–126)
Anion gap: 12 (ref 5–15)
BUN: 11 mg/dL (ref 6–20)
CO2: 22 mmol/L (ref 22–32)
Calcium: 9.7 mg/dL (ref 8.9–10.3)
Chloride: 102 mmol/L (ref 98–111)
Creatinine, Ser: 0.88 mg/dL (ref 0.44–1.00)
GFR, Estimated: >60 mL/min (ref >60)
Glucose, Bld: 88 mg/dL (ref 70–99)
Potassium: 3.4 mmol/L — ABNORMAL LOW (ref 3.5–5.1)
Sodium: 136 mmol/L (ref 135–145)
Total Bilirubin: 1.2 mg/dL (ref 0.0–1.2)
Total Protein: 7.7 g/dL (ref 6.5–8.1)

## 2024-04-23 LAB — URINALYSIS, ROUTINE W REFLEX MICROSCOPIC
Bilirubin Urine: NEGATIVE
Glucose, UA: NEGATIVE mg/dL
Hgb urine dipstick: NEGATIVE
Ketones, ur: 80 mg/dL — AB
Leukocytes,Ua: NEGATIVE
Nitrite: NEGATIVE
Protein, ur: 30 mg/dL — AB
Specific Gravity, Urine: 1.024 (ref 1.005–1.030)
pH: 6 (ref 5.0–8.0)

## 2024-04-23 LAB — POCT PREGNANCY, URINE: Preg Test, Ur: POSITIVE — AB

## 2024-04-23 MED ORDER — SCOPOLAMINE 1 MG/3DAYS TD PT72SCOPOLAMINE 1 MG/3DAYS
1.0000 | MEDICATED_PATCH | TRANSDERMAL | Status: DC
Start: 2024-04-23 — End: 2024-04-23
  Administered 2024-04-23: 1.5 mg via TRANSDERMAL
  Filled 2024-04-23: qty 1

## 2024-04-23 MED ORDER — SCOPOLAMINE 1 MG/3DAYS TD PT72: 1.0000 | MEDICATED_PATCH | TRANSDERMAL | 12 refills | Status: DC

## 2024-04-23 MED ORDER — ONDANSETRON 4 MG PO TBDP
4.0000 mg | ORAL_TABLET | Freq: Four times a day (QID) | ORAL | 0 refills | Status: DC | PRN
Start: 2024-04-23 — End: 2024-05-23

## 2024-04-23 MED ORDER — ONDANSETRON 4 MG PO TBDP
4.0000 mg | ORAL_TABLET | Freq: Once | ORAL | Status: AC
  Administered 2024-04-23: 4 mg via ORAL
  Filled 2024-04-23: qty 1

## 2024-04-23 MED ORDER — DOXYLAMINE-PYRIDOXINE 10-10 MG PO TBEC: 2.0000 | DELAYED_RELEASE_TABLET | Freq: Every day | ORAL | 5 refills | Status: DC

## 2024-04-23 MED ORDER — VITAFOL GUMMIES 3.33-0.333-34.8 MG PO CHEW: 1.0000 | CHEWABLE_TABLET | Freq: Every day | ORAL | 5 refills | Status: AC

## 2024-04-23 MED ORDER — LACTATED RINGERS IV BOLUS
1000.0000 mL | Freq: Once | INTRAVENOUS | Status: AC
  Administered 2024-04-23: 1000 mL via INTRAVENOUS

## 2024-04-23 NOTE — MAU Note (Signed)
 Mansi Tokar is a 28 y.o. at Unknown here in MAU reporting:  Patient reports vomiting that started yesterday morning around 0800-0900. She has been unable to keep anything down since then. Patient reports seeing blood in her emesis yesterday.  Positive home pregnancy test on 03/23/2024 LMP: 02/20/2024 Onset of complaint: yesterday  Pain score: denies  There were no vitals filed for this visit.    Lab orders placed from triage:  poct preg/ua

## 2024-04-23 NOTE — MAU Provider Note (Signed)
 Chief Complaint: Emesis  SUBJECTIVE HPI: Elaine Montgomery is a 28 y.o. G1P0 at [redacted]w[redacted]d by LMP who presents to maternity admissions reporting vomiting since yesterday morning around 8-9 am, with hematemesis x 1. Unable to keep anything down since. Positive home pregnancy test 03/23/2024 with LMP 02/20/2024. No pain.   She denies vaginal bleeding, vaginal itching/burning, urinary symptoms, h/a, dizziness, n/v, or fever/chills.    HPI  Past Medical History:  Diagnosis Date   Headache    Past Surgical History:  Procedure Laterality Date   WISDOM TOOTH EXTRACTION     Social History   Socioeconomic History   Marital status: Single    Spouse name: Not on file   Number of children: Not on file   Years of education: Not on file   Highest education level: Bachelor's degree (e.g., BA, AB, BS)  Occupational History   Not on file  Tobacco Use   Smoking status: Never   Smokeless tobacco: Never  Vaping Use   Vaping status: Never Used  Substance and Sexual Activity   Alcohol use: Never    Alcohol/week: 0.0 standard drinks of alcohol   Drug use: Yes    Types: Marijuana    Comment: every day   Sexual activity: Never    Birth control/protection: None  Other Topics Concern   Not on file  Social History Narrative   Not on file   Social Drivers of Health   Financial Resource Strain: Patient Declined (04/20/2023)   Overall Financial Resource Strain (CARDIA)    Difficulty of Paying Living Expenses: Patient declined  Food Insecurity: Patient Declined (04/20/2023)   Hunger Vital Sign    Worried About Running Out of Food in the Last Year: Patient declined    Ran Out of Food in the Last Year: Patient declined  Transportation Needs: No Transportation Needs (04/20/2023)   PRAPARE - Administrator, Civil Service (Medical): No    Lack of Transportation (Non-Medical): No  Physical Activity: Sufficiently Active (04/20/2023)   Exercise Vital Sign    Days of Exercise per Week: 3 days     Minutes of Exercise per Session: 60 min  Stress: No Stress Concern Present (04/20/2023)   [employer] of Occupational Health - Occupational Stress Questionnaire    Feeling of Stress : Only a little  Social Connections: Socially Isolated (04/20/2023)   Social Connection and Isolation Panel [NHANES]    Frequency of Communication with Friends and Family: Twice a week    Frequency of Social Gatherings with Friends and Family: Twice a week    Attends Religious Services: Never    Database administrator or Organizations: No    Attends Engineer, structural: Not on file    Marital Status: Never married  Intimate Partner Violence: Not on file   No current facility-administered medications on file prior to encounter.   No current outpatient medications on file prior to encounter.   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  I have reviewed patient's Past Medical Hx, Surgical Hx, Family Hx, Social Hx, medications and allergies.   Physical Exam  Patient Vitals for the past 24 hrs:  BP Temp Temp src Pulse Resp SpO2 Weight  04/23/24 1201 127/80 99 F (37.2 C) Oral 87 20 98 % --  04/23/24 1150 -- -- -- -- -- -- 48.6 kg   Constitutional: Well-developed, well-nourished female in no acute distress.  Cardiovascular: normal rate Respiratory: normal effort GI: Abd soft, non-tender. Pos BS x 4 MS: Extremities nontender, no edema, normal ROM Neurologic: Alert and oriented x 4.  GU: Neg CVAT.  Bedside US : SIUP, active fetus, FHR 164, subjectively adequate fluid. Yolk sac.   LAB RESULTS Results for orders placed or performed during the hospital encounter of 04/23/24 (from the past 24 hours)  Pregnancy, urine POC     Status: Abnormal   Collection Time: 04/23/24 11:53 AM  Result Value Ref Range   Preg Test, Ur POSITIVE (A) NEGATIVE  Urinalysis, Routine w reflex microscopic -Urine, Clean  Catch     Status: Abnormal   Collection Time: 04/23/24 11:57 AM  Result Value Ref Range   Color, Urine YELLOW YELLOW   APPearance HAZY (A) CLEAR   Specific Gravity, Urine 1.024 1.005 - 1.030   pH 6.0 5.0 - 8.0   Glucose, UA NEGATIVE NEGATIVE mg/dL   Hgb urine dipstick NEGATIVE NEGATIVE   Bilirubin Urine NEGATIVE NEGATIVE   Ketones, ur 80 (A) NEGATIVE mg/dL   Protein, ur 30 (A) NEGATIVE mg/dL   Nitrite NEGATIVE NEGATIVE   Leukocytes,Ua NEGATIVE NEGATIVE   RBC / HPF 0-5 0 - 5 RBC/hpf   WBC, UA 6-10 0 - 5 WBC/hpf   Bacteria, UA RARE (A) NONE SEEN   Squamous Epithelial / HPF 0-5 0 - 5 /HPF   Mucus PRESENT   Comprehensive metabolic panel     Status: Abnormal   Collection Time: 04/23/24  1:08 PM  Result Value Ref Range   Sodium 136 135 - 145 mmol/L   Potassium 3.4 (L) 3.5 - 5.1 mmol/L   Chloride 102 98 - 111 mmol/L   CO2 22 22 - 32 mmol/L   Glucose, Bld 88 70 - 99 mg/dL   BUN 11 6 - 20 mg/dL   Creatinine, Ser 1.61 0.44 - 1.00 mg/dL   Calcium 9.7 8.9 - 09.6 mg/dL   Total Protein 7.7 6.5 - 8.1 g/dL   Albumin 4.4 3.5 - 5.0 g/dL   AST 22 15 - 41 U/L   ALT 14 0 - 44 U/L   Alkaline Phosphatase 32 (L) 38 - 126 U/L   Total Bilirubin 1.2 0.0 - 1.2 mg/dL   GFR, Estimated >04 >54 mL/min   Anion gap 12 5 - 15       IMAGING No results found.  MAU Management/MDM: Orders Placed This Encounter  Procedures   Urinalysis, Routine w reflex microscopic -Urine, Clean Catch   Comprehensive metabolic panel   Pregnancy, urine POC   Discharge patient Discharge disposition: 01-Home or Self Care; Discharge patient date: 04/23/2024    Meds ordered this encounter  Medications   ondansetron  (ZOFRAN -ODT) disintegrating tablet 4 mg   scopolamine (TRANSDERM-SCOP) 1 MG/3DAYS 1.5 mg   Doxylamine-Pyridoxine (DICLEGIS) 10-10 MG TBEC    Sig: Take 2 tablets by mouth at bedtime. If symptoms persist, add one tablet in the morning and one in the afternoon    Dispense:  100 tablet    Refill:  5   Prenatal  Vit-Fe Phos-FA-Omega (VITAFOL GUMMIES) 3.33-0.333-34.8 MG CHEW    Sig: Chew 1 tablet by mouth daily.    Dispense:  90 tablet    Refill:  5   lactated ringers bolus 1,000 mL   scopolamine (TRANSDERM-SCOP) 1 MG/3DAYS    Sig: Place 1 patch (1.5 mg total) onto the skin every 3 (three) days.    Dispense:  10 patch    Refill:  12  ASSESSMENT 1. [redacted] weeks gestation of pregnancy   2. Nausea/vomiting in pregnancy   3. Esophagitis, erosive   No emesis since arrival. Nausea much improved after Zofran , scopolamine and IVF. Tolerating PO.   PLAN Discharge home with strict return precautions. Continue as scheduled prenatal care.   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 If symptoms persist, add one tablet in the morning and one in the afternoon   scopolamine 1 MG/3DAYS Commonly known as: TRANSDERM-SCOP Place 1 patch (1.5 mg total) onto the skin every 3 (three) days. Start taking on: April 26, 2024   Vitafol Gummies 3.33-0.333-34.8 MG Chew Chew 1 tablet by mouth daily.        Follow-up Information     [facility] Follow up.   Specialty: Obstetrics and Gynecology Why: As needed for pregnancy emergencies Contact information: [address] [place]  [address] [phone]                [place], MD FMOB Fellow, Faculty practice [facility], Center for [facility]  04/23/2024  2:21 PM

## 2024-04-23 NOTE — Discharge Instructions (Signed)

## 2024-04-25 DIAGNOSIS — Z681 Body mass index (BMI) 19 or less, adult: Secondary | ICD-10-CM | POA: Diagnosis not present

## 2024-04-25 DIAGNOSIS — Z113 Encounter for screening for infections with a predominantly sexual mode of transmission: Secondary | ICD-10-CM | POA: Diagnosis not present

## 2024-04-25 DIAGNOSIS — Z331 Pregnant state, incidental: Secondary | ICD-10-CM | POA: Diagnosis not present

## 2024-04-25 DIAGNOSIS — Z3481 Encounter for supervision of other normal pregnancy, first trimester: Secondary | ICD-10-CM | POA: Diagnosis not present

## 2024-04-25 DIAGNOSIS — Z91018 Allergy to other foods: Secondary | ICD-10-CM | POA: Diagnosis not present

## 2024-04-25 DIAGNOSIS — Z369 Encounter for antenatal screening, unspecified: Secondary | ICD-10-CM | POA: Diagnosis not present

## 2024-04-25 DIAGNOSIS — Z3A09 9 weeks gestation of pregnancy: Secondary | ICD-10-CM | POA: Diagnosis not present

## 2024-04-25 DIAGNOSIS — Z3401 Encounter for supervision of normal first pregnancy, first trimester: Secondary | ICD-10-CM | POA: Diagnosis not present

## 2024-05-23 ENCOUNTER — Inpatient Hospital Stay (HOSPITAL_COMMUNITY)
Admission: AD | Admit: 2024-05-23 | Discharge: 2024-05-23 | Disposition: A | Discharge status: Home / Self Care | Attending: Obstetrics and Gynecology | Admitting: Obstetrics and Gynecology

## 2024-05-23 DIAGNOSIS — O99281 Endocrine, nutritional and metabolic diseases complicating pregnancy, first trimester: Secondary | ICD-10-CM

## 2024-05-23 DIAGNOSIS — E860 Dehydration: Secondary | ICD-10-CM | POA: Diagnosis not present

## 2024-05-23 DIAGNOSIS — O219 Vomiting of pregnancy, unspecified: Secondary | ICD-10-CM | POA: Diagnosis not present

## 2024-05-23 DIAGNOSIS — Z3A13 13 weeks gestation of pregnancy: Secondary | ICD-10-CM

## 2024-05-23 DIAGNOSIS — O210 Mild hyperemesis gravidarum: Secondary | ICD-10-CM | POA: Insufficient documentation

## 2024-05-23 LAB — URINALYSIS, ROUTINE W REFLEX MICROSCOPIC
Bilirubin Urine: NEGATIVE
Glucose, UA: NEGATIVE mg/dL
Hgb urine dipstick: NEGATIVE
Ketones, ur: 20 mg/dL — AB
Leukocytes,Ua: NEGATIVE
Nitrite: NEGATIVE
Protein, ur: 30 mg/dL — AB
Specific Gravity, Urine: 1.017 (ref 1.005–1.030)
pH: 7 (ref 5.0–8.0)

## 2024-05-23 LAB — COMPREHENSIVE METABOLIC PANEL WITH GFR
ALT: 10 U/L (ref 0–44)
AST: 20 U/L (ref 15–41)
Albumin: 4.3 g/dL (ref 3.5–5.0)
Alkaline Phosphatase: 32 U/L — ABNORMAL LOW (ref 38–126)
Anion gap: 11 (ref 5–15)
BUN: <5 mg/dL — ABNORMAL LOW (ref 6–20)
CO2: 20 mmol/L — ABNORMAL LOW (ref 22–32)
Calcium: 9.4 mg/dL (ref 8.9–10.3)
Chloride: 105 mmol/L (ref 98–111)
Creatinine, Ser: 0.87 mg/dL (ref 0.44–1.00)
GFR, Estimated: >60 mL/min (ref >60)
Glucose, Bld: 82 mg/dL (ref 70–99)
Potassium: 3.8 mmol/L (ref 3.5–5.1)
Sodium: 136 mmol/L (ref 135–145)
Total Bilirubin: 1.3 mg/dL — ABNORMAL HIGH (ref 0.0–1.2)
Total Protein: 7.4 g/dL (ref 6.5–8.1)

## 2024-05-23 LAB — MAGNESIUM: Magnesium: 2 mg/dL (ref 1.7–2.4)

## 2024-05-23 LAB — CBC
HCT: 35.5 % — ABNORMAL LOW (ref 36.0–46.0)
Hemoglobin: 12.2 g/dL (ref 12.0–15.0)
MCH: 32.2 pg (ref 26.0–34.0)
MCHC: 34.4 g/dL (ref 30.0–36.0)
MCV: 93.7 fL (ref 80.0–100.0)
Platelets: 345 10*3/uL (ref 150–400)
RBC: 3.79 MIL/uL — ABNORMAL LOW (ref 3.87–5.11)
RDW: 13 % (ref 11.5–15.5)
WBC: 7.8 10*3/uL (ref 4.0–10.5)
nRBC: 0 % (ref 0.0–0.2)

## 2024-05-23 MED ORDER — METOCLOPRAMIDE HCL 10 MG PO TABS: 10.0000 mg | ORAL_TABLET | Freq: Four times a day (QID) | ORAL | 0 refills | Status: DC

## 2024-05-23 MED ORDER — METOCLOPRAMIDE HCL 5 MG/ML IJ SOLN
10.0000 mg | Freq: Once | INTRAMUSCULAR | Status: AC
  Administered 2024-05-23: 10 mg via INTRAVENOUS
  Filled 2024-05-23: qty 2

## 2024-05-23 MED ORDER — LACTATED RINGERS IV BOLUS
1000.0000 mL | Freq: Once | INTRAVENOUS | Status: AC
  Administered 2024-05-23: 1000 mL via INTRAVENOUS

## 2024-05-23 NOTE — MAU Provider Note (Signed)
 Chief Complaint:  Emesis   HPI   Elaine Montgomery is a 28 y.o. G1P0 at [redacted]w[redacted]d who presents to maternity admissions reporting inability to tolerate PO food or fluids at all today. She has scopolamine  patch on and took Zofran  with no resolve .  She denies any fever, Chills, and reports no sick contacts. She has had hyperemesis in pregnancy but her antiemetics are not working anymore. She offers no OB c/o. Denies VB, LOF, abdominal cramping   Pregnancy Course: [facility]  Past Medical History:  Diagnosis Date   Headache    OB History  Gravida Para Term Preterm AB Living  1       SAB IAB Ectopic Multiple Live Births          # Outcome Date GA Lbr Len/2nd Weight Sex Type Anes PTL Lv  1 Current            Past Surgical History:  Procedure Laterality Date   WISDOM TOOTH EXTRACTION     Family History  Problem Relation Age of Onset   High blood pressure Maternal Grandmother        Died at 54   Alzheimer's disease Maternal Grandfather        Died at 57   Cancer Paternal Grandfather        Age at time of death unknown   Breast cancer Paternal Aunt    Colon cancer Neg Hx    Esophageal cancer Neg Hx    Stomach cancer Neg Hx    Social History   Tobacco Use   Smoking status: Never   Smokeless tobacco: Never  Vaping Use   Vaping status: Never Used  Substance Use Topics   Alcohol use: Never    Alcohol/week: 0.0 standard drinks of alcohol   Drug use: Yes    Types: Marijuana    Comment: every day   Allergies  Allergen Reactions   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 If symptoms persist, add one tablet in the morning and one in the afternoon 100 tablet 5    ondansetron  (ZOFRAN -ODT) 4 MG disintegrating tablet Take 1 tablet (4 mg total) by mouth every 6 (six) hours as needed for nausea. 20 tablet 0     Prenatal Vit-Fe Phos-FA-Omega (VITAFOL  GUMMIES) 3.33-0.333-34.8 MG CHEW Chew 1 tablet by mouth daily. 90 tablet 5    scopolamine  (TRANSDERM-SCOP) 1 MG/3DAYS Place 1 patch (1.5 mg total) onto the skin every 3 (three) days. 10 patch 12     I have reviewed patient's Past Medical Hx, Surgical Hx, Family Hx, Social Hx, medications and allergies.   ROS  Pertinent items noted in HPI and remainder of comprehensive ROS otherwise negative.   PHYSICAL EXAM  Patient Vitals for the past 24 hrs:  BP Temp Pulse Resp Height Weight  05/23/24 1340 137/73 98.1 F (36.7 C) 74 18 5' 4 (1.626 m) 49 kg    Constitutional: Well-developed in no acute distress  Cardiovascular: normal rate & rhythm, warm and well-perfused Respiratory: normal effort, no problems with respiration noted GI: Abd soft, non-tender MS: Extremities nontender, no edema, normal ROM Neurologic: Alert and oriented x 4.  GU: no CVA tenderness     Fetal Tracing: 154 via Doppler   Labs: Results for orders placed or performed during the hospital encounter of 05/23/24 (from the past 24 hours)  CBC     Status: Abnormal   Collection Time: 05/23/24  1:51 PM  Result Value Ref Range   WBC 7.8 4.0 - 10.5 K/uL   RBC 3.79 (L) 3.87 - 5.11 MIL/uL   Hemoglobin 12.2 12.0 - 15.0 g/dL   HCT 64.4 (L) 63.9 - 53.9 %   MCV 93.7 80.0 - 100.0 fL   MCH 32.2 26.0 - 34.0 pg   MCHC 34.4 30.0 - 36.0 g/dL   RDW 86.9 88.4 - 84.4 %   Platelets 345 150 - 400 K/uL   nRBC 0.0 0.0 - 0.2 %  Comprehensive metabolic panel     Status: Abnormal   Collection Time: 05/23/24  1:51 PM  Result Value Ref Range   Sodium 136 135 - 145 mmol/L   Potassium 3.8 3.5 - 5.1 mmol/L   Chloride 105 98 - 111 mmol/L   CO2 20 (L) 22 - 32 mmol/L   Glucose, Bld 82 70 - 99 mg/dL   BUN <5 (L) 6 - 20 mg/dL   Creatinine, Ser 9.12 0.44 - 1.00 mg/dL   Calcium 9.4 8.9 - 89.6 mg/dL   Total Protein 7.4 6.5 - 8.1 g/dL   Albumin 4.3 3.5 - 5.0 g/dL   AST 20 15 - 41 U/L   ALT 10 0 - 44 U/L    Alkaline Phosphatase 32 (L) 38 - 126 U/L   Total Bilirubin 1.3 (H) 0.0 - 1.2 mg/dL   GFR, Estimated >39 >39 mL/min   Anion gap 11 5 - 15  Magnesium     Status: None   Collection Time: 05/23/24  1:51 PM  Result Value Ref Range   Magnesium 2.0 1.7 - 2.4 mg/dL     MDM & MAU COURSE  MDM:  HIGH  N/V in pregnancy ( R/O infection, dehydration, electrolyte abnormalities) - CBC,  -CMP -Magnesium level - U/A- pending at discharge - IVF Hydration for likely dehydration - IV Reglan ordered - for hyperemesis   Patient reassessed after IVF and IV Reglan @ 1552 with relief noted, labs unremarkable, will plan to discharge home with Reglan 10 mg PO q 6 prn for N/V in pregnancy    MAU Course: Orders Placed This Encounter  Procedures   CBC   Comprehensive metabolic panel   Magnesium   Urinalysis, Routine w reflex microscopic -Urine, Random   Discharge patient Discharge disposition: 01-Home or Self Care; Discharge patient date: 05/23/2024   Meds ordered this encounter  Medications   lactated ringers  bolus 1,000 mL   metoCLOPramide (REGLAN) injection 10 mg   metoCLOPramide (REGLAN) 10 MG tablet    Sig: Take 1 tablet (10 mg total) by mouth every 6 (six) hours.    Dispense:  30 tablet    Refill:  0    Supervising Provider:   PRATT, TANYA S [[id number]]    I have reviewed the patient chart and performed the physical exam . I have ordered & interpreted the lab results and reviewed them with the patient Medications ordered as stated below.  A/P as described below.  Counseling and education provided and patient agreeable  with plan as described below. Verbalized understanding.    ASSESSMENT   1. Nausea and vomiting during pregnancy   2. [redacted] weeks gestation of pregnancy   3. Dehydration during pregnancy     PLAN  Discharge home in stable condition with return precautions.   Follow up with your [facility] Provider   See AVS for full description of information given to the patient  including both  verbal and written. Patient verbalized understanding and agrees with the plan as described above.      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 If symptoms persist, add one tablet in the morning and one in the afternoon   metoCLOPramide 10 MG tablet Commonly known as: REGLAN Take 1 tablet (10 mg total) by mouth every 6 (six) hours.   scopolamine  1 MG/3DAYS Commonly known as: TRANSDERM-SCOP Place 1 patch (1.5 mg total) onto the skin every 3 (three) days.   Vitafol  Gummies 3.33-0.333-34.8 MG Chew Chew 1 tablet by mouth daily.        Olam Dalton, MSN, [facility], Center for [employer]

## 2024-05-23 NOTE — MAU Note (Signed)
 Elaine Montgomery is a 28 y.o. at [redacted]w[redacted]d here in MAU reporting: has not been able to keep anything down today. Took zofran  around 4 am did not help . Has scope patch on as well.   LMP:  Onset of complaint: this morning.  Pain score: 0 Vitals:   05/23/24 1340  BP: 137/73  Pulse: 74  Resp: 18  Temp: 98.1 F (36.7 C)     FHT: 154  Lab orders placed from triage: see orders

## 2024-05-23 NOTE — Discharge Instructions (Signed)
 Increase PO Hydration  Follow up with your OB

## 2024-05-24 ENCOUNTER — Encounter (HOSPITAL_COMMUNITY): Payer: Self-pay | Admitting: Obstetrics and Gynecology

## 2024-05-24 ENCOUNTER — Inpatient Hospital Stay (HOSPITAL_COMMUNITY)
Admission: AD | Admit: 2024-05-24 | Discharge: 2024-05-25 | Disposition: A | Discharge status: Home / Self Care | Payer: Self-pay | Attending: Family Medicine | Admitting: Family Medicine

## 2024-05-24 DIAGNOSIS — Z3A13 13 weeks gestation of pregnancy: Secondary | ICD-10-CM | POA: Diagnosis not present

## 2024-05-24 DIAGNOSIS — O219 Vomiting of pregnancy, unspecified: Secondary | ICD-10-CM | POA: Insufficient documentation

## 2024-05-24 DIAGNOSIS — O99281 Endocrine, nutritional and metabolic diseases complicating pregnancy, first trimester: Secondary | ICD-10-CM | POA: Diagnosis not present

## 2024-05-24 DIAGNOSIS — E860 Dehydration: Secondary | ICD-10-CM | POA: Insufficient documentation

## 2024-05-24 LAB — BASIC METABOLIC PANEL WITH GFR
Anion gap: 13 (ref 5–15)
BUN: 8 mg/dL (ref 6–20)
CO2: 18 mmol/L — ABNORMAL LOW (ref 22–32)
Calcium: 9.5 mg/dL (ref 8.9–10.3)
Chloride: 105 mmol/L (ref 98–111)
Creatinine, Ser: 0.9 mg/dL (ref 0.44–1.00)
GFR, Estimated: >60 mL/min (ref >60)
Glucose, Bld: 74 mg/dL (ref 70–99)
Potassium: 3.4 mmol/L — ABNORMAL LOW (ref 3.5–5.1)
Sodium: 136 mmol/L (ref 135–145)

## 2024-05-24 LAB — URINALYSIS, ROUTINE W REFLEX MICROSCOPIC
Bilirubin Urine: NEGATIVE
Glucose, UA: NEGATIVE mg/dL
Hgb urine dipstick: NEGATIVE
Ketones, ur: 80 mg/dL — AB
Leukocytes,Ua: NEGATIVE
Nitrite: NEGATIVE
Protein, ur: 30 mg/dL — AB
Specific Gravity, Urine: 1.023 (ref 1.005–1.030)
pH: 6 (ref 5.0–8.0)

## 2024-05-24 MED ORDER — ONDANSETRON HCL 4 MG/2ML IJ SOLN
4.0000 mg | Freq: Once | INTRAMUSCULAR | Status: AC
  Administered 2024-05-24: 4 mg via INTRAVENOUS
  Filled 2024-05-24: qty 2

## 2024-05-24 MED ORDER — LACTATED RINGERS IV SOLN
Freq: Once | INTRAVENOUS | Status: AC
Start: 2024-05-24 — End: 2024-05-25

## 2024-05-24 MED ORDER — FAMOTIDINE IN NACL 20-0.9 MG/50ML-% IV SOLN
20.0000 mg | Freq: Once | INTRAVENOUS | Status: AC
  Administered 2024-05-24: 20 mg via INTRAVENOUS
  Filled 2024-05-24: qty 50

## 2024-05-24 NOTE — MAU Provider Note (Signed)
 Chief Complaint: Emesis During Pregnancy   Event Date/Time   First Provider Initiated Contact with Patient 05/24/24 2155        SUBJECTIVE HPI: Elaine Montgomery is a 28 y.o. G1P0 at [redacted]w[redacted]d by LMP who presents to maternity admissions reporting nausea and vomiting.  Cannot keep meds down.. She denies vaginal bleeding, vaginal itching/burning, urinary symptoms, h/a, or fever/chills.     Emesis  This is a recurrent problem. Pertinent negatives include no abdominal pain or chills. Treatments tried: antinausea meds. The treatment provided no relief.   RN Note: Elaine Montgomery is a 28 y.o. at [redacted]w[redacted]d here in MAU reporting n/v today. Unable to keep down anything. Nausea meds won't stay down. Some blood in emesis. Pt was here yesterday for IVFs for n/v. Pt vomiting in Triage   Past Medical History:  Diagnosis Date   Headache    Past Surgical History:  Procedure Laterality Date   WISDOM TOOTH EXTRACTION     Social History   Socioeconomic History   Marital status: Single    Spouse name: Not on file   Number of children: Not on file   Years of education: Not on file   Highest education level: Bachelor's degree (e.g., BA, AB, BS)  Occupational History   Not on file  Tobacco Use   Smoking status: Never   Smokeless tobacco: Never  Vaping Use   Vaping status: Never Used  Substance and Sexual Activity   Alcohol use: Never    Alcohol/week: 0.0 standard drinks of alcohol   Drug use: Yes    Types: Marijuana    Comment: every day   Sexual activity: Never    Birth control/protection: None  Other Topics Concern   Not on file  Social History Narrative   Not on file   Social Drivers of Health   Financial Resource Strain: Patient Declined (04/20/2023)   Overall Financial Resource Strain (CARDIA)    Difficulty of Paying Living Expenses: Patient declined  Food Insecurity: Patient Declined (04/20/2023)   Hunger Vital Sign    Worried About Running Out of Food in the Last Year: Patient  declined    Ran Out of Food in the Last Year: Patient declined  Transportation Needs: No Transportation Needs (04/20/2023)   PRAPARE - Administrator, Civil Service (Medical): No    Lack of Transportation (Non-Medical): No  Physical Activity: Sufficiently Active (04/20/2023)   Exercise Vital Sign    Days of Exercise per Week: 3 days    Minutes of Exercise per Session: 60 min  Stress: No Stress Concern Present (04/20/2023)   [employer] of Occupational Health - Occupational Stress Questionnaire    Feeling of Stress : Only a little  Social Connections: Socially Isolated (04/20/2023)   Social Connection and Isolation Panel    Frequency of Communication with Friends and Family: Twice a week    Frequency of Social Gatherings with Friends and Family: Twice a week    Attends Religious Services: Never    Database administrator or Organizations: No    Attends Engineer, structural: Not on file    Marital Status: Never married  Intimate Partner Violence: Not on file   No current facility-administered medications on file prior to encounter.   Current Outpatient Medications on File Prior to Encounter  Medication Sig Dispense Refill   Doxylamine -Pyridoxine  (DICLEGIS ) 10-10 MG TBEC Take 2 tablets by mouth at bedtime. If symptoms persist, add one tablet in the morning and one in  the afternoon 100 tablet 5   metoCLOPramide  (REGLAN ) 10 MG tablet Take 1 tablet (10 mg total) by mouth every 6 (six) hours. 30 tablet 0   Prenatal Vit-Fe Phos-FA-Omega (VITAFOL  GUMMIES) 3.33-0.333-34.8 MG CHEW Chew 1 tablet by mouth daily. 90 tablet 5   scopolamine  (TRANSDERM-SCOP) 1 MG/3DAYS Place 1 patch (1.5 mg total) onto the skin every 3 (three) days. 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   ROS:  Review of Systems   Constitutional:  Negative for chills.  Gastrointestinal:  Positive for vomiting. Negative for abdominal pain.   Review of Systems  Other systems negative   Physical Exam  Physical Exam Patient Vitals for the past 24 hrs:  BP Temp Pulse Resp SpO2 Height Weight  05/24/24 2044 (!) 115/57 -- -- -- -- -- --  05/24/24 2041 -- 98.4 F (36.9 C) (!) 56 16 100 % 5' 4 (1.626 m) 48.1 kg   Constitutional: Well-developed, well-nourished female in no acute distress.  Cardiovascular: normal rate Respiratory: normal effort GI: Abd soft, non-tender.  MS: Extremities nontender, no edema, normal ROM Neurologic: Alert and oriented x 4.   FHT 166 by doppler  LAB RESULTS Results for orders placed or performed during the hospital encounter of 05/24/24 (from the past 24 hours)  Urinalysis, Routine w reflex microscopic -Urine, Clean Catch     Status: Abnormal   Collection Time: 05/24/24  8:59 PM  Result Value Ref Range   Color, Urine YELLOW YELLOW   APPearance HAZY (A) CLEAR   Specific Gravity, Urine 1.023 1.005 - 1.030   pH 6.0 5.0 - 8.0   Glucose, UA NEGATIVE NEGATIVE mg/dL   Hgb urine dipstick NEGATIVE NEGATIVE   Bilirubin Urine NEGATIVE NEGATIVE   Ketones, ur 80 (A) NEGATIVE mg/dL   Protein, ur 30 (A) NEGATIVE mg/dL   Nitrite NEGATIVE NEGATIVE   Leukocytes,Ua NEGATIVE NEGATIVE   RBC / HPF 0-5 0 - 5 RBC/hpf   WBC, UA 0-5 0 - 5 WBC/hpf   Bacteria, UA FEW (A) NONE SEEN   Squamous Epithelial / HPF 11-20 0 - 5 /HPF   Mucus PRESENT   Basic metabolic panel with GFR     Status: Abnormal   Collection Time: 05/24/24 10:22 PM  Result Value Ref Range   Sodium 136 135 - 145 mmol/L   Potassium 3.4 (L) 3.5 - 5.1 mmol/L   Chloride 105 98 - 111 mmol/L   CO2 18 (L) 22 - 32 mmol/L   Glucose, Bld 74 70 - 99 mg/dL   BUN 8 6 - 20 mg/dL   Creatinine, Ser 9.09 0.44 - 1.00 mg/dL   Calcium 9.5 8.9 - 89.6 mg/dL   GFR, Estimated >39 >39 mL/min   Anion gap 13 5 - 15        IMAGING No results  found.  MAU Management/MDM: I have reviewed the triage vital signs and the nursing notes.   Pertinent labs & imaging results that were available during my care of the patient were reviewed by me and considered in my medical decision making (see chart for details).      I have reviewed her medical records including past results, notes and treatments. Medical, Surgical, and family history were reviewed.  Medications and recent lab tests were reviewed    Treatments in MAU included IV fluids, antiemetics.  She did feel better after treatment.   ASSESSMENT Single IUP at [redacted]w[redacted]d Nausea and vomiting Mild dehydration  PLAN Discharge home Has meds at home  WIll add zofran   Pt stable at time of discharge. Encouraged to return here if she develops worsening of symptoms, increase in pain, fever, or other concerning symptoms.    Earnie Pouch CNM, MSN Certified Nurse-Midwife 05/24/2024  9:55 PM

## 2024-05-24 NOTE — MAU Note (Addendum)
 Elaine Montgomery is a 28 y.o. at [redacted]w[redacted]d here in MAU reporting n/v today. Unable to keep down anything. Nausea meds won't stay down. Some blood in emesis. Pt was here yesterday for IVFs for n/v. Pt vomiting in Triage  LMP: n/a Onset of complaint: all day Pain score: 0 Vitals:   05/24/24 2041 05/24/24 2044  BP:  (!) 115/57  Pulse: (!) 56   Resp: 16   Temp: 98.4 F (36.9 C)   SpO2: 100%      FHT: 166  Lab orders placed from triage: u/a

## 2024-05-25 DIAGNOSIS — E860 Dehydration: Secondary | ICD-10-CM | POA: Diagnosis not present

## 2024-05-25 DIAGNOSIS — O219 Vomiting of pregnancy, unspecified: Secondary | ICD-10-CM | POA: Diagnosis not present

## 2024-05-25 DIAGNOSIS — Z3A13 13 weeks gestation of pregnancy: Secondary | ICD-10-CM | POA: Diagnosis not present

## 2024-05-25 MED ORDER — ONDANSETRON 4 MG PO TBDP: 4.0000 mg | ORAL_TABLET | Freq: Four times a day (QID) | ORAL | 0 refills | Status: DC | PRN

## 2024-06-12 ENCOUNTER — Inpatient Hospital Stay (HOSPITAL_COMMUNITY)
Admission: AD | Admit: 2024-06-12 | Discharge: 2024-06-12 | Disposition: A | Discharge status: Home / Self Care | Attending: Obstetrics and Gynecology | Admitting: Obstetrics and Gynecology

## 2024-06-12 ENCOUNTER — Encounter (HOSPITAL_COMMUNITY): Payer: Self-pay | Admitting: Obstetrics and Gynecology

## 2024-06-12 ENCOUNTER — Other Ambulatory Visit: Payer: Self-pay

## 2024-06-12 DIAGNOSIS — K5900 Constipation, unspecified: Secondary | ICD-10-CM | POA: Diagnosis not present

## 2024-06-12 DIAGNOSIS — O99612 Diseases of the digestive system complicating pregnancy, second trimester: Secondary | ICD-10-CM | POA: Diagnosis not present

## 2024-06-12 DIAGNOSIS — O210 Mild hyperemesis gravidarum: Secondary | ICD-10-CM | POA: Diagnosis present

## 2024-06-12 DIAGNOSIS — Z3A16 16 weeks gestation of pregnancy: Secondary | ICD-10-CM

## 2024-06-12 LAB — URINALYSIS, ROUTINE W REFLEX MICROSCOPIC
Bilirubin Urine: NEGATIVE
Glucose, UA: NEGATIVE mg/dL
Hgb urine dipstick: NEGATIVE
Ketones, ur: 80 mg/dL — AB
Leukocytes,Ua: NEGATIVE
Nitrite: NEGATIVE
Protein, ur: 30 mg/dL — AB
Specific Gravity, Urine: 1.025 (ref 1.005–1.030)
pH: 6 (ref 5.0–8.0)

## 2024-06-12 LAB — BASIC METABOLIC PANEL WITH GFR
Anion gap: 10 (ref 5–15)
BUN: 6 mg/dL (ref 6–20)
CO2: 21 mmol/L — ABNORMAL LOW (ref 22–32)
Calcium: 9.3 mg/dL (ref 8.9–10.3)
Chloride: 103 mmol/L (ref 98–111)
Creatinine, Ser: 0.81 mg/dL (ref 0.44–1.00)
GFR, Estimated: >60 mL/min (ref >60)
Glucose, Bld: 83 mg/dL (ref 70–99)
Potassium: 3.8 mmol/L (ref 3.5–5.1)
Sodium: 134 mmol/L — ABNORMAL LOW (ref 135–145)

## 2024-06-12 MED ORDER — ONDANSETRON 4 MG PO TBDP: 4.0000 mg | ORAL_TABLET | Freq: Four times a day (QID) | ORAL | 1 refills | Status: DC | PRN

## 2024-06-12 MED ORDER — LACTATED RINGERS IV BOLUS: 1000.0000 mL | Freq: Once | INTRAVENOUS | Status: DC

## 2024-06-12 MED ORDER — METOCLOPRAMIDE HCL 5 MG/ML IJ SOLN: 10.0000 mg | Freq: Once | INTRAMUSCULAR | Status: DC

## 2024-06-12 MED ORDER — FAMOTIDINE IN NACL 20-0.9 MG/50ML-% IV SOLN: 20.0000 mg | Freq: Once | INTRAVENOUS | Status: DC

## 2024-06-12 MED ORDER — ONDANSETRON 4 MG PO TBDP
8.0000 mg | ORAL_TABLET | Freq: Once | ORAL | Status: AC
  Administered 2024-06-12: 8 mg via ORAL
  Filled 2024-06-12: qty 2

## 2024-06-12 MED ORDER — POLYETHYLENE GLYCOL 3350 17 GM/SCOOP PO POWD: 17.0000 g | Freq: Once | ORAL | 0 refills | Status: DC | PRN

## 2024-06-12 NOTE — MAU Note (Signed)
 Elaine Montgomery is a 28 y.o. at [redacted]w[redacted]d here in MAU reporting: she's been vomiting since 0500 yesterday morning.  States unable to keep anything down except water.  Reports has been taking meds, but recently ran out of Reglan .  Denies VB, reports stomach hurts, feels like it's burning.  LMP: 02/20/2024 Onset of complaint: yesterday Pain score: 7 Vitals:   06/12/24 1243  BP: (!) 119/58  Pulse: 66  Resp: 18  Temp: 98.1 F (36.7 C)  SpO2: 98%     FHT: 154 bpm  Lab orders placed from triage: UA

## 2024-06-12 NOTE — MAU Provider Note (Signed)
 S [name] is a 28 y.o. G1P0 female at [redacted]w[redacted]d who presents to MAU today with complaint of nausea and vomiting after running out of Zofran .  She has experienced hyperemesis gravidarum throughout her pregnancy and was previously taking Reglan  not as prescribed every morning and night with Zofran  in the afternoon and prn. She ran out of Zofran  a few days ago and has been experiencing nausea and vomiting since yesterday at 0500, and vomited 6 times yesterday and twice today. Reports some blood this morning in vomit, no acute change in abdominal or throat pain. No sick contacts, last bowel movement yesterday was hard pellets, reports irregular BM's and no blood in stool. Denies vaginal bleeding, contractions, dysuria, vaginal itchiness, fever, or headaches. Was not taking Diclegis  because of complicated schedule and window for medication to be taken.  Receives care at [facility]. Prenatal records reviewed.  Pertinent items noted in HPI and remainder of comprehensive ROS otherwise negative.   O BP (!) 119/58 (BP Location: Right Arm)   Pulse 66   Temp 98.1 F (36.7 C) (Oral)   Resp 18   Ht 5' 5 (1.651 m)   Wt 47.7 kg   LMP 02/20/2024   SpO2 98%   BMI 17.49 kg/m  Physical Exam Vitals reviewed.  Constitutional:      General: She is not in acute distress.    Appearance: She is ill-appearing.  Cardiovascular:     Rate and Rhythm: Normal rate.     Pulses: Normal pulses.  Pulmonary:     Effort: Pulmonary effort is normal.     Breath sounds: Normal breath sounds.  Abdominal:     General: Abdomen is flat.     Palpations: Abdomen is soft.  Musculoskeletal:        General: No swelling.  Skin:    General: Skin is warm and dry.  Neurological:     Mental Status: She is alert.  Psychiatric:        Mood and Affect: Mood normal.        Behavior: Behavior normal.    Results for orders placed or performed during the hospital encounter of 06/12/24 (from the past 24 hours)  Basic  metabolic panel     Status: Abnormal   Collection Time: 06/12/24  1:18 PM  Result Value Ref Range   Sodium 134 (L) 135 - 145 mmol/L   Potassium 3.8 3.5 - 5.1 mmol/L   Chloride 103 98 - 111 mmol/L   CO2 21 (L) 22 - 32 mmol/L   Glucose, Bld 83 70 - 99 mg/dL   BUN 6 6 - 20 mg/dL   Creatinine, Ser 9.18 0.44 - 1.00 mg/dL   Calcium 9.3 8.9 - 89.6 mg/dL   GFR, Estimated >39 >39 mL/min   Anion gap 10 5 - 15  Urinalysis, Routine w reflex microscopic -Urine, Clean Catch     Status: Abnormal   Collection Time: 06/12/24  1:20 PM  Result Value Ref Range   Color, Urine YELLOW YELLOW   APPearance HAZY (A) CLEAR   Specific Gravity, Urine 1.025 1.005 - 1.030   pH 6.0 5.0 - 8.0   Glucose, UA NEGATIVE NEGATIVE mg/dL   Hgb urine dipstick NEGATIVE NEGATIVE   Bilirubin Urine NEGATIVE NEGATIVE   Ketones, ur 80 (A) NEGATIVE mg/dL   Protein, ur 30 (A) NEGATIVE mg/dL   Nitrite NEGATIVE NEGATIVE   Leukocytes,Ua NEGATIVE NEGATIVE   RBC / HPF 0-5 0 - 5 RBC/hpf   WBC, UA 6-10 0 -  5 WBC/hpf   Bacteria, UA RARE (A) NONE SEEN   Squamous Epithelial / HPF 11-20 0 - 5 /HPF   Mucus PRESENT     MDM:   Hyperemesis Gravidarum: Moderately well controlled on current regimen for HG, ran out of zofran  and reports that was when symptoms worsened. No sick contacts, fever, or unusual food making infectious etiology like gastroenteritis or food poisoning less likely. PE notable for mild dehydration and tolerating PO meds. Constipation: Likely multifactorial given anti-emetic regimen and pregnancy. Reports hard pellets, irregular BM's and no blood in stool.  MAU Course: -UA 80 ketones with 30 protein, likely due to dehydration and poor PO intake, rare bacteria so low concern for UTI. -BMP unremarkable  13:30 tolerated a cup of water with medication, though still nauseous, given 8mg  zofran  PO.  14:20 No emesis, going to trial some crackers and water.  15:00 Still no emesis, reports nausea has improved  A 1.  Hyperemesis gravidarum (Primary)  2. [redacted] weeks gestation of pregnancy  3. Constipation during pregnancy in second trimester   Medical screening exam complete  P Discharge from MAU in stable condition with strict return precautions Follow up at [facility] as scheduled for ongoing prenatal care -Started MiraLax  for constipation -d/c diclegis  due to constipation and pt preference -Continue previous regimen scopolamine  1 patch every 3 days, metoclopramide  10 mg PO QID, and ondansetron  4 mg PO for breakthrough nausea/vomiting -Continue PNV  No future appointments. Allergies as of 06/12/2024       Reactions   Cashew Nut Oil Anaphylaxis   Other Anaphylaxis   Pistachio Nut Extract Anaphylaxis   Cashew Nut (anacardium Occidentale) Skin Test Hives        Medication List     STOP taking these medications    Doxylamine -Pyridoxine  10-10 MG Tbec Commonly known as: Diclegis        TAKE these medications    metoCLOPramide  10 MG tablet Commonly known as: REGLAN  Take 1 tablet (10 mg total) by mouth every 6 (six) hours.   ondansetron  4 MG disintegrating tablet Commonly known as: ZOFRAN -ODT Take 1 tablet (4 mg total) by mouth every 6 (six) hours as needed for nausea.   polyethylene glycol powder 17 GM/SCOOP powder Commonly known as: MiraLax  Take 17 g by mouth once as needed for up to 1 dose for mild constipation.   scopolamine  1 MG/3DAYS Commonly known as: TRANSDERM-SCOP Place 1 patch (1.5 mg total) onto the skin every 3 (three) days.   Vitafol  Gummies 3.33-0.333-34.8 MG Chew Chew 1 tablet by mouth daily.        Fairy Amy, MD

## 2024-06-12 NOTE — Discharge Instructions (Addendum)
 Thank you for coming in today, we have made some changes to the medications to help with your nausea. Remember to avoid fasting longer than 1-2 hours, and eat small bland meals regularly. Chicken, potatoes, crackers, bread or toast are good options.   Start -Miralax  one scoop every morning and night as needed to achieve soft bowel movements daily, can decrease to once every morning when regular bowel movements are achieved.  Continue -Reglan  10 mg every 6 hours regardless of symptoms -Scopolamine  patch changed every 3 days -Zofran  every 6 hours as needed for break through nausea

## 2024-06-25 ENCOUNTER — Other Ambulatory Visit: Payer: Self-pay

## 2024-06-25 ENCOUNTER — Encounter (HOSPITAL_COMMUNITY): Payer: Self-pay | Admitting: Obstetrics and Gynecology

## 2024-06-25 ENCOUNTER — Inpatient Hospital Stay (HOSPITAL_COMMUNITY)
Admission: AD | Admit: 2024-06-25 | Discharge: 2024-06-25 | Disposition: A | Discharge status: Home / Self Care | Attending: Obstetrics and Gynecology | Admitting: Obstetrics and Gynecology

## 2024-06-25 DIAGNOSIS — O99322 Drug use complicating pregnancy, second trimester: Secondary | ICD-10-CM | POA: Diagnosis not present

## 2024-06-25 DIAGNOSIS — K920 Hematemesis: Secondary | ICD-10-CM | POA: Insufficient documentation

## 2024-06-25 DIAGNOSIS — Z3A18 18 weeks gestation of pregnancy: Secondary | ICD-10-CM

## 2024-06-25 DIAGNOSIS — O99612 Diseases of the digestive system complicating pregnancy, second trimester: Secondary | ICD-10-CM | POA: Diagnosis not present

## 2024-06-25 DIAGNOSIS — O210 Mild hyperemesis gravidarum: Secondary | ICD-10-CM | POA: Diagnosis not present

## 2024-06-25 DIAGNOSIS — F1290 Cannabis use, unspecified, uncomplicated: Secondary | ICD-10-CM | POA: Diagnosis not present

## 2024-06-25 LAB — COMPREHENSIVE METABOLIC PANEL WITH GFR
ALT: 13 U/L (ref 0–44)
AST: 21 U/L (ref 15–41)
Albumin: 4.3 g/dL (ref 3.5–5.0)
Alkaline Phosphatase: 47 U/L (ref 38–126)
Anion gap: 11 (ref 5–15)
BUN: 6 mg/dL (ref 6–20)
CO2: 20 mmol/L — ABNORMAL LOW (ref 22–32)
Calcium: 9.4 mg/dL (ref 8.9–10.3)
Chloride: 106 mmol/L (ref 98–111)
Creatinine, Ser: 0.82 mg/dL (ref 0.44–1.00)
GFR, Estimated: >60 mL/min (ref >60)
Glucose, Bld: 92 mg/dL (ref 70–99)
Potassium: 3.9 mmol/L (ref 3.5–5.1)
Sodium: 137 mmol/L (ref 135–145)
Total Bilirubin: 1.1 mg/dL (ref 0.0–1.2)
Total Protein: 7.8 g/dL (ref 6.5–8.1)

## 2024-06-25 LAB — URINALYSIS, ROUTINE W REFLEX MICROSCOPIC
Bilirubin Urine: NEGATIVE
Glucose, UA: NEGATIVE mg/dL
Hgb urine dipstick: NEGATIVE
Ketones, ur: 80 mg/dL — AB
Leukocytes,Ua: NEGATIVE
Nitrite: NEGATIVE
Protein, ur: 100 mg/dL — AB
Specific Gravity, Urine: 1.026 (ref 1.005–1.030)
pH: 6 (ref 5.0–8.0)

## 2024-06-25 LAB — CBC
HCT: 37.1 % (ref 36.0–46.0)
Hemoglobin: 12.7 g/dL (ref 12.0–15.0)
MCH: 32.4 pg (ref 26.0–34.0)
MCHC: 34.2 g/dL (ref 30.0–36.0)
MCV: 94.6 fL (ref 80.0–100.0)
Platelets: 364 K/uL (ref 150–400)
RBC: 3.92 MIL/uL (ref 3.87–5.11)
RDW: 12.9 % (ref 11.5–15.5)
WBC: 8.4 K/uL (ref 4.0–10.5)
nRBC: 0 % (ref 0.0–0.2)

## 2024-06-25 MED ORDER — DIPHENHYDRAMINE HCL 50 MG/ML IJ SOLN
25.0000 mg | Freq: Once | INTRAMUSCULAR | Status: AC
  Administered 2024-06-25: 25 mg via INTRAVENOUS
  Filled 2024-06-25: qty 1

## 2024-06-25 MED ORDER — FAMOTIDINE 20 MG PO TABS: 20.0000 mg | ORAL_TABLET | Freq: Two times a day (BID) | ORAL | 0 refills | Status: DC

## 2024-06-25 MED ORDER — FAMOTIDINE IN NACL 20-0.9 MG/50ML-% IV SOLN
20.0000 mg | Freq: Once | INTRAVENOUS | Status: AC
  Administered 2024-06-25: 20 mg via INTRAVENOUS
  Filled 2024-06-25: qty 50

## 2024-06-25 MED ORDER — LACTATED RINGERS IV BOLUS
1000.0000 mL | Freq: Once | INTRAVENOUS | Status: AC
  Administered 2024-06-25: 1000 mL via INTRAVENOUS

## 2024-06-25 MED ORDER — HALOPERIDOL LACTATE 5 MG/ML IJ SOLN
5.0000 mg | Freq: Once | INTRAMUSCULAR | Status: AC
  Administered 2024-06-25: 5 mg via INTRAVENOUS
  Filled 2024-06-25: qty 1

## 2024-06-25 NOTE — MAU Note (Signed)
 Elaine Montgomery is a 28 y.o. at [redacted]w[redacted]d here in MAU reporting: she's vomiting blood, reports she's vomited at least 5 times today.  Reports she's unable to keep anything down.  Denies VB or LOF.  LMP: 02/20/2024 Onset of complaint: yesterday Pain score: 6 Vitals:   06/25/24 1607  BP: 123/65  Pulse: 90  Resp: 18  Temp: 98.7 F (37.1 C)  SpO2: 100%     FHT: 161 bpm  Lab orders placed from triage: UA

## 2024-06-25 NOTE — Discharge Instructions (Signed)
 Please avoid further cannabis use and continue Pepcid  daily.

## 2024-06-25 NOTE — MAU Provider Note (Cosign Needed Addendum)
 History     CSN: [id number]  Arrival date and time: 06/25/24 1542   Event Date/Time   First Provider Initiated Contact with Patient 06/25/2024  4:05 PM   Chief Complaint  Patient presents with   Nausea   Emesis    HPI  Elaine Montgomery is a 28 y.o. G1P0 at [redacted]w[redacted]d who presents to the MAU for hematemesis. Patient states emesis started last night and is worse this morning. She has had several episodes of emesis initially of food and later with green bile. Last two episodes with hematemesis which prompted her visit to the MAU. She has been unable to tolerate PO intake. She does endorse abdominal pain with emesis. Patient also reports daily cannabis use, sometimes up to 5x/day. Patient denies eating anything that may have caused her sx. Denies fever, sick contacts, diarrhea, bloody stools, vaginal bleeding/discharge, cramping.  Patient was previously seen in the MAU for similar symptoms on 7/22, 7/3, 7/2, and 6/2. Medications from her most recent visit include scopolamine  patch, Reglan  and Zofran  which have been working for her symptoms until last night.  Past Medical History:  Diagnosis Date   Headache     Past Surgical History:  Procedure Laterality Date   WISDOM TOOTH EXTRACTION      Family History  Problem Relation Age of Onset   High blood pressure Maternal Grandmother        Died at 28   Alzheimer's disease Maternal Grandfather        Died at 33   Cancer Paternal Grandfather        Age at time of death unknown   Breast cancer Paternal Aunt    Colon cancer Neg Hx    Esophageal cancer Neg Hx    Stomach cancer Neg Hx     Social History   Tobacco Use   Smoking status: Never   Smokeless tobacco: Never  Vaping Use   Vaping status: Never Used  Substance Use Topics   Alcohol use: Never    Alcohol/week: 0.0 standard drinks of alcohol   Drug use: Yes    Types: Marijuana    Comment: every day    Allergies:  Allergies  Allergen Reactions   Cashew Nut Oil  Anaphylaxis   Other Anaphylaxis   Pistachio Nut Extract Anaphylaxis   Cashew Nut (Anacardium Occidentale) Skin Test Hives    Medications Prior to Admission  Medication Sig Dispense Refill Last Dose/Taking   metoCLOPramide  (REGLAN ) 10 MG tablet Take 1 tablet (10 mg total) by mouth every 6 (six) hours. 30 tablet 0 06/25/2024 at 11:00 AM   ondansetron  (ZOFRAN -ODT) 4 MG disintegrating tablet Take 1 tablet (4 mg total) by mouth every 6 (six) hours as needed for up to 60 doses for nausea. 30 tablet 1 06/25/2024 at  8:00 AM   scopolamine  (TRANSDERM-SCOP) 1 MG/3DAYS Place 1 patch (1.5 mg total) onto the skin every 3 (three) days. 10 patch 12 06/24/2024   polyethylene glycol powder (MIRALAX ) 17 GM/SCOOP powder Take 17 g by mouth once as needed for up to 1 dose for mild constipation. 255 g 0    Prenatal Vit-Fe Phos-FA-Omega (VITAFOL  GUMMIES) 3.33-0.333-34.8 MG CHEW Chew 1 tablet by mouth daily. 90 tablet 5     ROS reviewed and pertinent positives and negatives as documented in HPI.  Physical Exam   Blood pressure 123/65, pulse 90, temperature 98.7 F (37.1 C), temperature source Oral, resp. rate 18, height 5' 5 (1.651 m), weight 48.8 kg, last menstrual period 02/20/2024, SpO2  100%.  Physical Exam General: Patient is actively retching during exam. HEENT: Normocephalic, atraumatic, EOM grossly intact Respiratory: Normal effort of breathing on RA Abdomen: Soft, non-tender, non-distended Extremities: Moves all four extremities appropriately, normal gait. No peripheral edema. Neuro: No obvious focal deficits Skin: No lesions/rashes visualized  MAU Course  Procedures  MDM 28 y.o. G1P0 at [redacted]w[redacted]d presenting for hematemesis. Patient has previously been seen in the MAU for hyperemesis gravidarum. While symptoms have been well managed with her current regimen until now, recurring emesis in the setting of daily cannabis use raises suspicion for cannabinoid hyperemesis syndrome. No sick contacts, fever,  diarrhea make infectious etiology such as gastroenteritis or food poisoning less likely. Will obtain UA and CMP to assess hydration status and CBC to assess severity of hematemesis. Will begin treatment with Haldol  5mg  and Benadryl  given severity of sx. Will also start pepcid  and LR bolus x1 for symptomatic relief.  1740: CBC and CMP normal. UA cloudy in appearance and amber in color with 80 ketones and 100 protein likely 2/2 emesis. No other signs of dehydration.  Patient is significantly improved after Haldol  making cannabinoid hyperemesis syndrome the likely diagnosis.  Assessment and Plan     ICD-10-CM   1. Cannabinoid hyperemesis syndrome  R11.2 Discharge patient   F12.90     2. [redacted] weeks gestation of pregnancy  Z3A.18     3. Hyperemesis gravidarum  O21.0     - Patient improved after haldol  - Provided education on cannabis cessation - Continue Reglan , Phenergan and scopolamine  as prescribed by provider - Start Pepcid  BID  Darren Jernigan, DO 06/25/2024, 4:51 PM    I confirm that I have verified and agree with the information documented in the resident's note.   History Elaine Montgomery is a 28 y.o. G1P0 at [redacted]w[redacted]d who presents with n/v & epigastric pain. Has had daily n/v with this pregnancy. Currently taking reglan , zofran , & scop patch. Has vomited multiple times today. Had bright red blood in vomit this morning. Has worsening mid epigastric pain. Reports daily marijuana use.  Denies fever/chills, diarrhea, lower abdominal pain, or vaginal bleeding.   Physical exam BP 123/65 (BP Location: Right Arm)   Pulse 90   Temp 98.7 F (37.1 C) (Oral)   Resp 18   Ht 5' 5 (1.651 m)   Wt 48.8 kg   LMP 02/20/2024   SpO2 100%   BMI 17.89 kg/m   Physical Examination: General appearance - alert, well appearing, and in no distress Mental status - alert, oriented to person, place, and time Eyes - pupils equal and reactive, extraocular eye movements intact, sclera anicteric Chest -  normal respiratory effort  MDM Results for orders placed or performed during the hospital encounter of 06/25/24 (from the past 24 hours)  Urinalysis, Routine w reflex microscopic -Urine, Clean Catch     Status: Abnormal   Collection Time: 06/25/24  4:12 PM  Result Value Ref Range   Color, Urine AMBER (A) YELLOW   APPearance CLOUDY (A) CLEAR   Specific Gravity, Urine 1.026 1.005 - 1.030   pH 6.0 5.0 - 8.0   Glucose, UA NEGATIVE NEGATIVE mg/dL   Hgb urine dipstick NEGATIVE NEGATIVE   Bilirubin Urine NEGATIVE NEGATIVE   Ketones, ur 80 (A) NEGATIVE mg/dL   Protein, ur 899 (A) NEGATIVE mg/dL   Nitrite NEGATIVE NEGATIVE   Leukocytes,Ua NEGATIVE NEGATIVE   RBC / HPF 0-5 0 - 5 RBC/hpf   WBC, UA 0-5 0 - 5 WBC/hpf   Bacteria,  UA RARE (A) NONE SEEN   Squamous Epithelial / HPF 21-50 0 - 5 /HPF   Mucus PRESENT   CBC     Status: None   Collection Time: 06/25/24  4:57 PM  Result Value Ref Range   WBC 8.4 4.0 - 10.5 K/uL   RBC 3.92 3.87 - 5.11 MIL/uL   Hemoglobin 12.7 12.0 - 15.0 g/dL   HCT 62.8 63.9 - 53.9 %   MCV 94.6 80.0 - 100.0 fL   MCH 32.4 26.0 - 34.0 pg   MCHC 34.2 30.0 - 36.0 g/dL   RDW 87.0 88.4 - 84.4 %   Platelets 364 150 - 400 K/uL   nRBC 0.0 0.0 - 0.2 %  Comprehensive metabolic panel with GFR     Status: Abnormal   Collection Time: 06/25/24  4:57 PM  Result Value Ref Range   Sodium 137 135 - 145 mmol/L   Potassium 3.9 3.5 - 5.1 mmol/L   Chloride 106 98 - 111 mmol/L   CO2 20 (L) 22 - 32 mmol/L   Glucose, Bld 92 70 - 99 mg/dL   BUN 6 6 - 20 mg/dL   Creatinine, Ser 9.17 0.44 - 1.00 mg/dL   Calcium 9.4 8.9 - 89.6 mg/dL   Total Protein 7.8 6.5 - 8.1 g/dL   Albumin 4.3 3.5 - 5.0 g/dL   AST 21 15 - 41 U/L   ALT 13 0 - 44 U/L   Alkaline Phosphatase 47 38 - 126 U/L   Total Bilirubin 1.1 0.0 - 1.2 mg/dL   GFR, Estimated >39 >39 mL/min   Anion gap 11 5 - 15     Assessment/Plan 1. Cannabinoid hyperemesis syndrome   2. [redacted] weeks gestation of pregnancy   3. Hyperemesis  gravidarum    -FHT present via doppler -CBC & CMP unremarkable -Resolution of symptoms with IV fluids, pepcid , haldol , & benadryl  -Rx pepcid  BID -Encouraged to discontinue marijuana usage  Jerilynn Longs, NP 06/25/2024 7:12 PM

## 2024-07-16 ENCOUNTER — Other Ambulatory Visit: Payer: Self-pay

## 2024-07-21 ENCOUNTER — Other Ambulatory Visit: Payer: Self-pay

## 2024-07-23 ENCOUNTER — Inpatient Hospital Stay (HOSPITAL_COMMUNITY)
Admission: AD | Admit: 2024-07-23 | Discharge: 2024-07-23 | Disposition: A | Discharge status: Home / Self Care | Attending: Obstetrics and Gynecology | Admitting: Obstetrics and Gynecology

## 2024-07-23 ENCOUNTER — Encounter (HOSPITAL_COMMUNITY): Payer: Self-pay | Admitting: Obstetrics and Gynecology

## 2024-07-23 DIAGNOSIS — O219 Vomiting of pregnancy, unspecified: Secondary | ICD-10-CM | POA: Insufficient documentation

## 2024-07-23 DIAGNOSIS — Z3A22 22 weeks gestation of pregnancy: Secondary | ICD-10-CM

## 2024-07-23 DIAGNOSIS — O26892 Other specified pregnancy related conditions, second trimester: Secondary | ICD-10-CM | POA: Diagnosis not present

## 2024-07-23 DIAGNOSIS — F1210 Cannabis abuse, uncomplicated: Secondary | ICD-10-CM | POA: Diagnosis not present

## 2024-07-23 DIAGNOSIS — O99322 Drug use complicating pregnancy, second trimester: Secondary | ICD-10-CM | POA: Diagnosis not present

## 2024-07-23 LAB — URINALYSIS, ROUTINE W REFLEX MICROSCOPIC
Bilirubin Urine: NEGATIVE
Glucose, UA: NEGATIVE mg/dL
Hgb urine dipstick: NEGATIVE
Ketones, ur: 5 mg/dL — AB
Leukocytes,Ua: NEGATIVE
Nitrite: NEGATIVE
Protein, ur: NEGATIVE mg/dL
Specific Gravity, Urine: 1.016 (ref 1.005–1.030)
pH: 7 (ref 5.0–8.0)

## 2024-07-23 MED ORDER — LACTATED RINGERS IV BOLUS
1000.0000 mL | Freq: Once | INTRAVENOUS | Status: AC
  Administered 2024-07-23: 1000 mL via INTRAVENOUS

## 2024-07-23 MED ORDER — ONDANSETRON 8 MG PO TBDP
4.0000 mg | ORAL_TABLET | Freq: Three times a day (TID) | ORAL | 0 refills | Status: DC | PRN
Start: 2024-07-23 — End: 2024-09-25

## 2024-07-23 MED ORDER — FAMOTIDINE IN NACL 20-0.9 MG/50ML-% IV SOLN
20.0000 mg | Freq: Once | INTRAVENOUS | Status: AC
  Administered 2024-07-23: 20 mg via INTRAVENOUS
  Filled 2024-07-23: qty 50

## 2024-07-23 MED ORDER — METOCLOPRAMIDE HCL 10 MG PO TABS: 10.0000 mg | ORAL_TABLET | Freq: Three times a day (TID) | ORAL | 0 refills | Status: DC

## 2024-07-23 MED ORDER — SODIUM CHLORIDE 0.9 % IV SOLN
8.0000 mg | Freq: Once | INTRAVENOUS | Status: AC
  Administered 2024-07-23: 8 mg via INTRAVENOUS
  Filled 2024-07-23: qty 4

## 2024-07-23 MED ORDER — PROMETHAZINE HCL 25 MG PO TABS
25.0000 mg | ORAL_TABLET | Freq: Every evening | ORAL | 1 refills | Status: DC | PRN
Start: 2024-07-23 — End: 2024-09-25

## 2024-07-23 NOTE — MAU Provider Note (Signed)
 History     CSN: [id number]  Arrival date and time: 07/23/24 1349   Event Date/Time   First Provider Initiated Contact with Patient 07/23/24 1525      Chief Complaint  Patient presents with   Nausea   Emesis   HPI  Ms.Elaine Montgomery is a 28 y.o. female G1P0 @ [redacted]w[redacted]d here in MAU with complaints of nausea and vomiting. This has been an ongoing issue during her pregnancy; and she has been diagnosed with cannabis hyperemesis. She attests to smoking marijuana. She wants to quit but worried about the nausea and not sleeping at night. Marijuana helps with both of these.   OB History     Gravida  1   Para      Term      Preterm      AB      Living         SAB      IAB      Ectopic      Multiple      Live Births              Past Medical History:  Diagnosis Date   Headache     Past Surgical History:  Procedure Laterality Date   WISDOM TOOTH EXTRACTION      Family History  Problem Relation Age of Onset   Healthy Mother    Kidney failure Father    Breast cancer Paternal Aunt    High blood pressure Maternal Grandmother        Died at 77   Alzheimer's disease Maternal Grandfather        Died at 28   Cancer Paternal Grandfather        Age at time of death unknown   Colon cancer Neg Hx    Esophageal cancer Neg Hx    Stomach cancer Neg Hx     Social History   Tobacco Use   Smoking status: Never   Smokeless tobacco: Never  Vaping Use   Vaping status: Never Used  Substance Use Topics   Alcohol use: Never    Alcohol/week: 0.0 standard drinks of alcohol   Drug use: Yes    Types: Marijuana    Comment: every day    Allergies:  Allergies  Allergen Reactions   Cashew Nut Oil Anaphylaxis   Other Anaphylaxis   Pistachio Nut Extract Anaphylaxis   Cashew Nut (Anacardium Occidentale) Skin Test Hives    Medications Prior to Admission  Medication Sig Dispense Refill Last Dose/Taking   famotidine  (PEPCID ) 20 MG tablet Take 1 tablet (20 mg  total) by mouth 2 (two) times daily. 60 tablet 0 07/22/2024 Morning   metoCLOPramide  (REGLAN ) 10 MG tablet Take 1 tablet (10 mg total) by mouth every 6 (six) hours. 30 tablet 0 07/23/2024 at  9:00 AM   ondansetron  (ZOFRAN -ODT) 4 MG disintegrating tablet Take 1 tablet (4 mg total) by mouth every 6 (six) hours as needed for up to 60 doses for nausea. 30 tablet 1 Past Week   polyethylene glycol powder (MIRALAX ) 17 GM/SCOOP powder Take 17 g by mouth once as needed for up to 1 dose for mild constipation. 255 g 0 Past Week   Prenatal Vit-Fe Phos-FA-Omega (VITAFOL  GUMMIES) 3.33-0.333-34.8 MG CHEW Chew 1 tablet by mouth daily. 90 tablet 5 07/22/2024   scopolamine  (TRANSDERM-SCOP) 1 MG/3DAYS Place 1 patch (1.5 mg total) onto the skin every 3 (three) days. 10 patch 12 Past Week   Results for orders placed  or performed during the hospital encounter of 07/23/24 (from the past 24 hours)  Urinalysis, Routine w reflex microscopic -Urine, Clean Catch     Status: Abnormal   Collection Time: 07/23/24  2:02 PM  Result Value Ref Range   Color, Urine YELLOW YELLOW   APPearance CLEAR CLEAR   Specific Gravity, Urine 1.016 1.005 - 1.030   pH 7.0 5.0 - 8.0   Glucose, UA NEGATIVE NEGATIVE mg/dL   Hgb urine dipstick NEGATIVE NEGATIVE   Bilirubin Urine NEGATIVE NEGATIVE   Ketones, ur 5 (A) NEGATIVE mg/dL   Protein, ur NEGATIVE NEGATIVE mg/dL   Nitrite NEGATIVE NEGATIVE   Leukocytes,Ua NEGATIVE NEGATIVE     Review of Systems  Gastrointestinal:  Positive for nausea and vomiting. Negative for abdominal pain.  Genitourinary:  Negative for vaginal bleeding and vaginal discharge.   Physical Exam   Blood pressure 130/68, pulse 66, temperature 98.2 F (36.8 C), resp. rate 18, height 5' 5 (1.651 m), weight 49.4 kg, last menstrual period 02/20/2024, SpO2 100%.  Physical Exam Constitutional:      General: She is not in acute distress.    Appearance: Normal appearance. She is not ill-appearing, toxic-appearing or  diaphoretic.  Skin:    General: Skin is warm.  Neurological:     Mental Status: She is alert and oriented to person, place, and time.  Psychiatric:        Behavior: Behavior normal.    MAU Course  Procedures  MDM  Lr bolus x 1 Zofran  8 mg IV and Pepcid  20 mg given IV Patient tolerated oral fluids following treatment.   Assessment and Plan   A:  Cannabis abuse  [redacted] weeks gestation of pregnancy  Nausea and vomiting in pregnancy    P:  Dc home Rx: Phenergan  to help with sleep Take Reglan  TID, Zofran  as needed Encouraged her to continue to work on [facility] use Return to MAU if symptoms worsen  Vidal Lampkins, Delon FERNS, NP 07/23/2024 5:56 PM

## 2024-07-23 NOTE — MAU Note (Signed)
 Elaine Montgomery is a 28 y.o. at [redacted]w[redacted]d here in MAU reporting: having n/v since last night. Has been taking meds q 6 hours. Has not been able to keep much down. Doesn't feel dehydration but afraid she is going to get there quickly if she doesn't;t stop the vomiting. Taking , zofran , reglan , pepcid  scop patch.  LMP:  Onset of complaint: last night Pain score: 0 Vitals:   07/23/24 1413  BP: 130/76  Pulse: 84  Resp: 18  Temp: 98.2 F (36.8 C)     FHT: 159  Lab orders placed from triage: u/a

## 2024-07-25 DIAGNOSIS — Z363 Encounter for antenatal screening for malformations: Secondary | ICD-10-CM | POA: Diagnosis not present

## 2024-07-25 DIAGNOSIS — Z3A22 22 weeks gestation of pregnancy: Secondary | ICD-10-CM | POA: Diagnosis not present

## 2024-09-20 ENCOUNTER — Encounter (HOSPITAL_COMMUNITY): Payer: Self-pay | Admitting: Obstetrics and Gynecology

## 2024-09-20 ENCOUNTER — Other Ambulatory Visit: Payer: Self-pay

## 2024-09-20 ENCOUNTER — Observation Stay (HOSPITAL_COMMUNITY)

## 2024-09-20 ENCOUNTER — Inpatient Hospital Stay (HOSPITAL_COMMUNITY)
Admission: AD | Admit: 2024-09-20 | Discharge: 2024-09-25 | DRG: 787 | Disposition: A | Discharge status: Home / Self Care | Attending: Obstetrics and Gynecology | Admitting: Obstetrics and Gynecology

## 2024-09-20 DIAGNOSIS — Z3A30 30 weeks gestation of pregnancy: Secondary | ICD-10-CM | POA: Diagnosis not present

## 2024-09-20 DIAGNOSIS — O1414 Severe pre-eclampsia complicating childbirth: Secondary | ICD-10-CM | POA: Diagnosis present

## 2024-09-20 DIAGNOSIS — O99324 Drug use complicating childbirth: Secondary | ICD-10-CM | POA: Diagnosis present

## 2024-09-20 DIAGNOSIS — O1413 Severe pre-eclampsia, third trimester: Secondary | ICD-10-CM | POA: Diagnosis not present

## 2024-09-20 DIAGNOSIS — O365911 Maternal care for other known or suspected poor fetal growth, first trimester, fetus 1: Secondary | ICD-10-CM | POA: Diagnosis present

## 2024-09-20 DIAGNOSIS — O365930 Maternal care for other known or suspected poor fetal growth, third trimester, not applicable or unspecified: Principal | ICD-10-CM | POA: Diagnosis present

## 2024-09-20 DIAGNOSIS — F1290 Cannabis use, unspecified, uncomplicated: Secondary | ICD-10-CM | POA: Diagnosis present

## 2024-09-20 DIAGNOSIS — O133 Gestational [pregnancy-induced] hypertension without significant proteinuria, third trimester: Secondary | ICD-10-CM | POA: Diagnosis not present

## 2024-09-20 DIAGNOSIS — Z148 Genetic carrier of other disease: Secondary | ICD-10-CM

## 2024-09-20 DIAGNOSIS — Z3687 Encounter for antenatal screening for uncertain dates: Secondary | ICD-10-CM | POA: Diagnosis not present

## 2024-09-20 DIAGNOSIS — O1490 Unspecified pre-eclampsia, unspecified trimester: Secondary | ICD-10-CM | POA: Diagnosis present

## 2024-09-20 DIAGNOSIS — Z98891 History of uterine scar from previous surgery: Secondary | ICD-10-CM

## 2024-09-20 DIAGNOSIS — O283 Abnormal ultrasonic finding on antenatal screening of mother: Secondary | ICD-10-CM | POA: Diagnosis not present

## 2024-09-20 DIAGNOSIS — Z364 Encounter for antenatal screening for fetal growth retardation: Secondary | ICD-10-CM | POA: Diagnosis not present

## 2024-09-20 DIAGNOSIS — O365931 Maternal care for other known or suspected poor fetal growth, third trimester, fetus 1: Principal | ICD-10-CM | POA: Diagnosis present

## 2024-09-20 LAB — TYPE AND SCREEN
ABO/RH(D): B POS
Antibody Screen: NEGATIVE

## 2024-09-20 LAB — URIC ACID: Uric Acid, Serum: 6.2 mg/dL (ref 2.5–7.1)

## 2024-09-20 LAB — RAPID URINE DRUG SCREEN, HOSP PERFORMED
Amphetamines: NOT DETECTED
Barbiturates: NOT DETECTED
Benzodiazepines: NOT DETECTED
Cocaine: NOT DETECTED
Opiates: NOT DETECTED
Tetrahydrocannabinol: POSITIVE — AB

## 2024-09-20 LAB — COMPREHENSIVE METABOLIC PANEL WITH GFR
ALT: 14 U/L (ref 0–44)
AST: 21 U/L (ref 15–41)
Albumin: 3.2 g/dL — ABNORMAL LOW (ref 3.5–5.0)
Alkaline Phosphatase: 159 U/L — ABNORMAL HIGH (ref 38–126)
Anion gap: 10 (ref 5–15)
BUN: 7 mg/dL (ref 6–20)
CO2: 20 mmol/L — ABNORMAL LOW (ref 22–32)
Calcium: 8.6 mg/dL — ABNORMAL LOW (ref 8.9–10.3)
Chloride: 104 mmol/L (ref 98–111)
Creatinine, Ser: 0.94 mg/dL (ref 0.44–1.00)
GFR, Estimated: >60 mL/min (ref >60)
Glucose, Bld: 80 mg/dL (ref 70–99)
Potassium: 3.9 mmol/L (ref 3.5–5.1)
Sodium: 134 mmol/L — ABNORMAL LOW (ref 135–145)
Total Bilirubin: 0.7 mg/dL (ref 0.0–1.2)
Total Protein: 6.8 g/dL (ref 6.5–8.1)

## 2024-09-20 LAB — CBC
HCT: 32.2 % — ABNORMAL LOW (ref 36.0–46.0)
Hemoglobin: 11.6 g/dL — ABNORMAL LOW (ref 12.0–15.0)
MCH: 33.2 pg (ref 26.0–34.0)
MCHC: 36 g/dL (ref 30.0–36.0)
MCV: 92.3 fL (ref 80.0–100.0)
Platelets: 221 K/uL (ref 150–400)
RBC: 3.49 MIL/uL — ABNORMAL LOW (ref 3.87–5.11)
RDW: 13.1 % (ref 11.5–15.5)
WBC: 5.4 K/uL (ref 4.0–10.5)
nRBC: 0 % (ref 0.0–0.2)

## 2024-09-20 LAB — LACTATE DEHYDROGENASE: LDH: 169 U/L (ref 98–192)

## 2024-09-20 LAB — PROTEIN / CREATININE RATIO, URINE
Creatinine, Urine: 105 mg/dL
Protein Creatinine Ratio: 0.1 mg/mg{creat} (ref 0.00–0.15)
Total Protein, Urine: 11 mg/dL

## 2024-09-20 MED ORDER — ONDANSETRON 4 MG PO TBDP
4.0000 mg | ORAL_TABLET | Freq: Three times a day (TID) | ORAL | Status: AC | PRN
Start: 2024-09-20 — End: ?
  Administered 2024-09-20: 4 mg via ORAL
  Filled 2024-09-20 (×2): qty 1

## 2024-09-20 MED ORDER — HYDRALAZINE HCL 20 MG/ML IJ SOLN: 10.0000 mg | INTRAMUSCULAR | Status: DC | PRN

## 2024-09-20 MED ORDER — DOCUSATE SODIUM 100 MG PO CAPS
100.0000 mg | ORAL_CAPSULE | Freq: Every day | ORAL | Status: DC
  Administered 2024-09-20 – 2024-09-22 (×3): 100 mg via ORAL
  Filled 2024-09-20 (×3): qty 1

## 2024-09-20 MED ORDER — ACETAMINOPHEN 325 MG PO TABS
650.0000 mg | ORAL_TABLET | ORAL | Status: DC | PRN
  Administered 2024-09-21 – 2024-09-23 (×4): 650 mg via ORAL
  Filled 2024-09-20 (×4): qty 2

## 2024-09-20 MED ORDER — NIFEDIPINE 10 MG PO CAPS
10.0000 mg | ORAL_CAPSULE | Freq: Once | ORAL | Status: AC
  Administered 2024-09-20: 10 mg via ORAL
  Filled 2024-09-20: qty 1

## 2024-09-20 MED ORDER — MAGNESIUM SULFATE 40 GM/1000ML IV SOLN
2.0000 g/h | INTRAVENOUS | Status: AC
  Administered 2024-09-20: 2 g/h via INTRAVENOUS
  Filled 2024-09-20: qty 1000

## 2024-09-20 MED ORDER — MAGNESIUM SULFATE BOLUS VIA INFUSION
4.0000 g | Freq: Once | INTRAVENOUS | Status: AC
  Administered 2024-09-20: 4 g via INTRAVENOUS
  Filled 2024-09-20: qty 1000

## 2024-09-20 MED ORDER — HYDRALAZINE HCL 20 MG/ML IJ SOLN
5.0000 mg | INTRAMUSCULAR | Status: DC | PRN
Start: 2024-09-20 — End: 2024-09-23
  Administered 2024-09-20: 5 mg via INTRAVENOUS
  Filled 2024-09-20: qty 1

## 2024-09-20 MED ORDER — FAMOTIDINE 20 MG PO TABS
20.0000 mg | ORAL_TABLET | Freq: Every day | ORAL | Status: DC
  Administered 2024-09-20 – 2024-09-21 (×2): 20 mg via ORAL
  Filled 2024-09-20 (×2): qty 1

## 2024-09-20 MED ORDER — LACTATED RINGERS IV SOLN: INTRAVENOUS | Status: AC

## 2024-09-20 MED ORDER — ONDANSETRON HCL 4 MG/2ML IJ SOLN
4.0000 mg | Freq: Four times a day (QID) | INTRAMUSCULAR | Status: DC | PRN
  Administered 2024-09-20 – 2024-09-22 (×2): 4 mg via INTRAVENOUS
  Filled 2024-09-20 (×3): qty 2

## 2024-09-20 MED ORDER — PRENATAL MULTIVITAMIN CH
1.0000 | ORAL_TABLET | Freq: Every day | ORAL | Status: DC
Start: 2024-09-20 — End: 2024-09-23
  Administered 2024-09-20 – 2024-09-22 (×3): 1 via ORAL
  Filled 2024-09-20 (×3): qty 1

## 2024-09-20 MED ORDER — METOCLOPRAMIDE HCL 5 MG/ML IJ SOLN
10.0000 mg | Freq: Once | INTRAMUSCULAR | Status: AC
  Administered 2024-09-20: 10 mg via INTRAVENOUS
  Filled 2024-09-20: qty 2

## 2024-09-20 MED ORDER — METOCLOPRAMIDE HCL 10 MG PO TABS
10.0000 mg | ORAL_TABLET | Freq: Three times a day (TID) | ORAL | Status: DC | PRN
  Administered 2024-09-20: 10 mg via ORAL
  Filled 2024-09-20: qty 1

## 2024-09-20 MED ORDER — LABETALOL HCL 5 MG/ML IV SOLN: 20.0000 mg | INTRAVENOUS | Status: DC | PRN

## 2024-09-20 MED ORDER — LABETALOL HCL 5 MG/ML IV SOLN: 40.0000 mg | INTRAVENOUS | Status: DC | PRN

## 2024-09-20 MED ORDER — LACTATED RINGERS IV SOLN: 125.0000 mL/h | INTRAVENOUS | Status: AC

## 2024-09-20 MED ORDER — BETAMETHASONE SOD PHOS & ACET 6 (3-3) MG/ML IJ SUSP
12.0000 mg | INTRAMUSCULAR | Status: AC
  Administered 2024-09-20 – 2024-09-21 (×2): 12 mg via INTRAMUSCULAR
  Filled 2024-09-20 (×2): qty 5

## 2024-09-20 MED ORDER — CALCIUM CARBONATE ANTACID 500 MG PO CHEW: 2.0000 | CHEWABLE_TABLET | ORAL | Status: DC | PRN

## 2024-09-20 NOTE — Consult Note (Addendum)
 MFM Consult Note  Elaine Montgomery is currently at 30 weeks and 3 days.  She was seen in consultation at the request of Dr. Henry due to severe IUGR with absent end-diastolic flow noted on umbilical artery Doppler studies performed in the office.    The patient reports that the only complication in her current pregnancy prior to today was hyperemesis gravidarum.  She has been smoking marijuana to help relieve the nausea.    This is her first pregnancy.  She denies any history of hypertension.    On admission, the patient was noted to have severe range blood pressures requiring IV hydralazine for blood pressure control.  She reports seeing spots in front of her eyes earlier today.  Her blood pressures on admission were 152/100 and 164/96.  Her blood pressures are now in the 120s to 130s over 90s range.  Her PIH labs on admission were all within normal limits, other than a uric acid of 6.2.  Her P/C ratio on admission was 0.10.  Due to severe IUGR with possible severe preeclampsia, she was started on magnesium sulfate for maternal seizure prophylaxis and fetal neuroprotection.  She was also given the first dose of betamethasone.  Sonographic findings Single intrauterine pregnancy. Fetal cardiac activity: Observed. Presentation: Cephalic. Fetal biometry shows the estimated fetal weight of 1 pound 12 ounces which measures at the < 1 percentile for her gestational age, indicating severe IUGR. Amniotic fluid: Within normal limits.  AFI: 10.5 cm.  MVP: 5.0 cm. Placenta: Anterior. BPP: 6/8.  She received a -2 for fetal breathing movements that did not meet criteria.   The views of the fetal anatomy were limited today due to her advanced gestational age.  There are limitations of prenatal ultrasound such as the inability to detect certain abnormalities due to poor visualization. Various factors such as fetal position, gestational age and maternal body habitus may increase the difficulty in visualizing  the fetal anatomy.    Doppler studies of the umbilical arteries performed today showed absent end-diastolic flow.  There were no signs of reversed end-diastolic flow noted.  IUGR with absent end-diastolic flow  The patient was advised that due to the abnormal umbilical artery Doppler studies noted today, placental dysfunction is the most likely cause of IUGR.    She understands that placental dysfunction also places women at risk for development of early onset severe preeclampsia.  Due to IUGR with absent end-diastolic flow, inpatient management until delivery is recommended in order to avoid an adverse pregnancy outcome such as an IUFD.  Possible preeclampsia  The implications and management of preeclampsia was discussed with the patient.    She was advised that preeclampsia can affect both the mother and the fetus.    In the mother, preeclampsia may cause a rise in blood pressures and it can affect the mother's kidney, liver, and platelet functions.  It may also cause the mother to have seizures.    In the fetus, it may cause growth restriction and oligohydramnios.    She understands that delivery is the only treatment for preeclampsia.  Due to severe IUGR with abnormal umbilical artery Doppler studies and possible preeclampsia, she should receive a complete course of antenatal corticosteroids.  She should be kept on magnesium sulfate for maternal seizure prophylaxis and fetal neuroprotection until she completes her steroid course.  The patient was advised that we will try to delay delivery for as long as possible.  Due to severe IUGR with absent end-diastolic flow, delivery is recommended  at around 34 weeks.  However, should her blood pressures be in the severe range or if her PIH labs show any abnormalities, delivery will be recommended once she completes her steroid course.    Delivery is also recommended at any time should the fetal status be nonreassuring.   Delivery will  also be recommended should her future umbilical artery Doppler studies show reversed end-diastolic flow.  Should she remain undelivered once she completes her steroid course,  she should continue daily fetal testing with twice a day NSTs.  Twice-weekly BPP's and twice weekly umbilical artery Doppler studies should be performed.  Should she require delivery at less than 32 weeks, she should receive magnesium sulfate for fetal neuroprotection.  She should also receive magnesium sulfate for maternal seizure prophylaxis for 24 hours after delivery.   A rescue course of antenatal corticosteroids should be given should she require delivery prior to 34 weeks and it has been 7 days since she received her initial course.  The patient understands that her baby will require a NICU admission following delivery.    She stated that all of her questions were answered today.  A total of 80 minutes was spent counseling and coordinating the care for this patient.  Recommendations: Inpatient management until delivery Continue magnesium sulfate until steroid course is completed Daily fetal testing  Twice-weekly weekly biophysical profile and umbilical artery Doppler studies Rescue course of corticosteroids as indicated Delivery at 34 weeks Delivery prior to 34 weeks would be indicated:   Should her blood pressures be persistently greater than 160/100  Should she require IV push medications for blood pressure control once she completes her steroid course  Should she complain of any signs and symptoms of severe preeclampsia  Should her preeclampsia labs show any abnormalities  At any time for nonreassuring fetal status  Should her umbilical artery Doppler studies show reversed end-diastolic flow

## 2024-09-20 NOTE — MAU Note (Signed)
.  Elaine Montgomery is a 28 y.o. at [redacted]w[redacted]d here in MAU reporting: She was sent from the office for more monitoring. Denies LOF, VB. Reports +FM.  LMP: na Onset of complaint: na Pain score: none There were no vitals filed for this visit.   FHT: 144  Lab orders placed from triage: na

## 2024-09-20 NOTE — Progress Notes (Addendum)
 S: Pt eye spots have resolved, cxt noted on fetal monitoring, SVE 0.5/80/-2, no vaginal bleeding nor leakage of fluid was noted, pt now is aware what she has been feeling is cxt, but felt it was very mild and thought it was only baby movement, pt now admitts that feeling has been going on for over 1 weeks.   O: BP 122/60 (BP Location: Left Arm)   Pulse (!) 55   Temp 97.8 F (36.6 C) (Oral)   Resp 18   Ht 5' 3 (1.6 m)   Wt 52.2 kg   LMP 02/20/2024   SpO2 100%   BMI 20.37 kg/m  Minimal varability noted on FHT 135-140, no decels, no acels CXT: Q2-4, moderate per palpation but mild per pt preceptions SVE: 0.5/80/-2  A/P: Called Dr Henry for updated of POC, recommended sending UC/GC/C. Given procardia 10mg  PO once now for tocolysis, increase fluid to 125ml/hr r/t possible dehydration for 8 hours then return to 52ml/her at 2300 on 10/30.  Elaine Weimer  FNP/PMHNP/CNM 09/20/24 3:04 PM

## 2024-09-20 NOTE — Consult Note (Addendum)
 [facility]  Prenatal Consult       09/20/2024  7:25 PM   I was asked by Dr. Henry to consult on this patient for possible/anticipated [redacted]w[redacted]d preterm delivery. I had the pleasure of meeting with Elaine Montgomery and her family today. She is a 28yo G1P0. Pregnancy complicated by severe IUGR <3% with absent end flow diastolic doppler,  new onset preEclampsia with severe range BP . She and her partner are expecting a baby boy, to be named [employer].   I explained that the neonatal intensive care team would be present for the delivery and outlined the likely delivery room course for this baby including routine resuscitation and NRP-guided approaches to the treatment of respiratory distress. We discussed other common problems associated with prematurity including respiratory distress syndrome/CLD, apnea, feeding issues, temperature regulation, and infection risk. We briefly discussed IVH/PVL, ROP, and NEC and that these are complications associated with prematurity, but that by 30 weeks are uncommon.   We discussed the average length of stay but I noted that the actual LOS would depend on the severity of problems encountered and response to treatments. We discussed visitation policies and the resources available while her child is in the hospital.  We discussed the importance of good nutrition and various methods of providing nutrition (parenteral hyperalimentation, gavage feedings and/or oral feeding). We discussed the benefits of human milk. I encouraged breast feeding and pumping soon after birth and outlined resources that are available to support breast feeding. Mother is interested in breastfeeding. We discussed the possibility of using donor breast milk as a bridge and she will consider it.  Thank you for involving us  in the care of this patient. A member of our team will be available should the family have additional questions.  Time for consultation: approximately 20 minutes of  face-to-face time in discussion of the risks and medical care associated with preterm delivery, 35 minutes total.   Elaine Lies, MD Attending Neonatologist

## 2024-09-20 NOTE — H&P (Addendum)
 Elaine Montgomery is a 28 y.o. female, G1P0000, IUP at 30.3 weeks, presenting for admission for severe IUGR <3% with absent end flow diastolic dopplers. New on set PreEclampsia with SF R/T severe range BP (Bouts of bradycardia)  that required IV hydralazine and scotomata. Full Work up pending. H/O decreased eating habits BMI 19.9 at NOB, MJ usage during this pregnancy with hyperemesis gravidarum. 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 Denies vaginal leakage. Denies vaginal bleeding. Denies feeling cxt's.     Patient Active Problem List   Diagnosis Date Noted   IUGR (intrauterine growth restriction) affecting care of mother, third trimester, fetus 1 09/20/2024   Hyperemesis gravidarum 05/23/2024   Migraine without aura and without status migrainosus, not intractable 11/12/2014   Chronic tension-type headache, not intractable 11/12/2014     Active Ambulatory Problems    Diagnosis Date Noted   Migraine without aura and without status migrainosus, not intractable 11/12/2014   Chronic tension-type headache, not intractable 11/12/2014   Hyperemesis gravidarum 05/23/2024   Resolved Ambulatory Problems    Diagnosis Date Noted   No Resolved Ambulatory Problems   Past Medical History:  Diagnosis Date   Headache       Medications Prior to Admission  Medication Sig Dispense Refill Last Dose/Taking   famotidine  (PEPCID ) 20 MG tablet Take 1 tablet (20 mg total) by mouth 2 (two) times daily. 60 tablet 0    metoCLOPramide  (REGLAN ) 10 MG tablet Take 1 tablet (10 mg total) by mouth 3 (three) times daily before meals. 30 tablet 0    ondansetron  (ZOFRAN -ODT) 8 MG disintegrating tablet Take 0.5 tablets (4 mg total) by mouth every 8 (eight) hours as needed for up to 60 doses for nausea. 30 tablet 0    polyethylene glycol powder (MIRALAX ) 17 GM/SCOOP powder Take 17 g by mouth once as needed for up to 1 dose for mild constipation. 255 g 0    Prenatal Vit-Fe Phos-FA-Omega (VITAFOL  GUMMIES)  3.33-0.333-34.8 MG CHEW Chew 1 tablet by mouth daily. 90 tablet 5    promethazine  (PHENERGAN ) 25 MG tablet Take 1 tablet (25 mg total) by mouth at bedtime as needed for nausea or vomiting. 30 tablet 1    scopolamine  (TRANSDERM-SCOP) 1 MG/3DAYS Place 1 patch (1.5 mg total) onto the skin every 3 (three) days. 10 patch 12     Past Medical History:  Diagnosis Date   Headache      No current facility-administered medications on file prior to encounter.   Current Outpatient Medications on File Prior to Encounter  Medication Sig Dispense Refill   famotidine  (PEPCID ) 20 MG tablet Take 1 tablet (20 mg total) by mouth 2 (two) times daily. 60 tablet 0   metoCLOPramide  (REGLAN ) 10 MG tablet Take 1 tablet (10 mg total) by mouth 3 (three) times daily before meals. 30 tablet 0   ondansetron  (ZOFRAN -ODT) 8 MG disintegrating tablet Take 0.5 tablets (4 mg total) by mouth every 8 (eight) hours as needed for up to 60 doses for nausea. 30 tablet 0   polyethylene glycol powder (MIRALAX ) 17 GM/SCOOP powder Take 17 g by mouth once as needed for up to 1 dose for mild constipation. 255 g 0   Prenatal Vit-Fe Phos-FA-Omega (VITAFOL  GUMMIES) 3.33-0.333-34.8 MG CHEW Chew 1 tablet by mouth daily. 90 tablet 5   promethazine  (PHENERGAN ) 25 MG tablet Take 1 tablet (25 mg total) by mouth at bedtime as needed for nausea or vomiting. 30 tablet 1   scopolamine  (TRANSDERM-SCOP) 1  MG/3DAYS Place 1 patch (1.5 mg total) onto the skin every 3 (three) days. 10 patch 12     Allergies  Allergen Reactions   Cashew Nut Oil Anaphylaxis   Other Anaphylaxis   Pistachio Nut Extract Anaphylaxis   Cashew Nut (Anacardium Occidentale) Skin Test Hives    History of present pregnancy: Pt Info/Preference:  Screening/Consents:  Labs:   EDD: Estimated Date of Delivery: 11/26/24  Establised: Patient's last menstrual period was 02/20/2024.  Anatomy Scan: Date: 9/3 Placenta Location: anterior Genetic Screen: Panoroma:LR AFP:  First  Tri: Quad: Horizon: SMA carrier.  Office: [facility]            First PNV: 9.6 weeks Blood Type --/--/B POS (10/30 1115)  Language: english Last PNV: 26.3 weeks Rhogam    Flu Vaccine:  declined   Antibody NEG (10/30 1115)  TDaP vaccine declined   GTT: Early: 5.1 Third Trimester: 65  Feeding Plan: Bottle BTL: no Rubella:  immune  Contraception: ??? VBAC: NO RPR:   nr  Circumcision: Most liekly   HBsAg:  neg  Pediatrician:  ???   HIV:   neg  Prenatal Classes: no Additional US : Yes 10/30, BPP 8/8, absent end flow on dolpers, 3% 1.8lbs was EFW.  GBS:  unknwon(For PCN allergy, check sensitivities)       Chlamydia: neg    MFM Referral/Consult:  GC: neg  Support Person: mother   PAP: ???  Pain Management: epidural Neonatologist Referral:  Hgb Electrophoresis:  AA  Birth Plan: Mode unknwon at this time    Hgb NOB: 12.4    28W: 12.6   OB History     Gravida  1   Para      Term      Preterm      AB      Living         SAB      IAB      Ectopic      Multiple      Live Births             Past Medical History:  Diagnosis Date   Headache    Past Surgical History:  Procedure Laterality Date   WISDOM TOOTH EXTRACTION     Family History: family history includes Alzheimer's disease in her maternal grandfather; Breast cancer in her paternal aunt; Cancer in her paternal grandfather; Healthy in her mother; High blood pressure in her maternal grandmother; Kidney failure in her father. Social History:  reports that she has never smoked. She has never used smokeless tobacco. She reports current drug use. Drug: Marijuana. She reports that she does not drink alcohol.   Prenatal Transfer Tool  Maternal Diabetes: No Genetic Screening: Abnormal:  Results: Other:SMA carrier on horizon.  Maternal Ultrasounds/Referrals: IUGR Fetal Ultrasounds or other Referrals:  Referred to Materal Fetal Medicine  Maternal Substance Abuse:  Yes:  Type: Marijuana Significant Maternal Medications:  Meds  include: Other: pepcid , Zofran , regalan, and phenergan  Significant Maternal Lab Results: Other: GBS unknown.   ROS:  Review of Systems  Constitutional: Negative.   HENT: Negative.    Eyes:        Visualizes spots   Respiratory: Negative.    Cardiovascular: Negative.   Gastrointestinal: Negative.   Genitourinary: Negative.   Musculoskeletal: Negative.   Skin: Negative.   Neurological: Negative.   Endo/Heme/Allergies: Negative.   Psychiatric/Behavioral: Negative.       Physical Exam: BP (!) 137/90   Pulse 90   Temp 98.3 F (  36.8 C) (Oral)   Resp 18   Ht 5' 3 (1.6 m)   Wt 52.2 kg   LMP 02/20/2024   SpO2 99%   BMI 20.37 kg/m   Physical Exam Vitals and nursing note reviewed.  Constitutional:      Appearance: Normal appearance.  HENT:     Head: Normocephalic and atraumatic.     Nose: Nose normal.     Mouth/Throat:     Mouth: Mucous membranes are moist.  Eyes:     Conjunctiva/sclera: Conjunctivae normal.  Cardiovascular:     Rate and Rhythm: Regular rhythm. Bradycardia present.     Pulses: Normal pulses.     Heart sounds: Normal heart sounds.  Pulmonary:     Effort: Pulmonary effort is normal.     Breath sounds: Normal breath sounds.  Abdominal:     General: Bowel sounds are normal.  Genitourinary:    Comments: Uterus gravida less then dates Musculoskeletal:     Cervical back: Normal range of motion and neck supple.  Skin:    General: Skin is warm.     Capillary Refill: Capillary refill takes less than 2 seconds.  Neurological:     General: No focal deficit present.     Mental Status: She is alert.  Psychiatric:        Mood and Affect: Mood normal.      NST: FHR baseline 145 bpm, Variability: minimal , Accelerations:not present, Decelerations:  Absent= Cat 2/Non-reactive but appropriate for gestational age UC:   Occ  Labs: Results for orders placed or performed during the hospital encounter of 09/20/24 (from the past 24 hours)  CBC     Status:  Abnormal   Collection Time: 09/20/24 10:30 AM  Result Value Ref Range   WBC 5.4 4.0 - 10.5 K/uL   RBC 3.49 (L) 3.87 - 5.11 MIL/uL   Hemoglobin 11.6 (L) 12.0 - 15.0 g/dL   HCT 67.7 (L) 63.9 - 53.9 %   MCV 92.3 80.0 - 100.0 fL   MCH 33.2 26.0 - 34.0 pg   MCHC 36.0 30.0 - 36.0 g/dL   RDW 86.8 88.4 - 84.4 %   Platelets 221 150 - 400 K/uL   nRBC 0.0 0.0 - 0.2 %  Protein / creatinine ratio, urine     Status: None   Collection Time: 09/20/24 10:50 AM  Result Value Ref Range   Creatinine, Urine 105 mg/dL   Total Protein, Urine 11 mg/dL   Protein Creatinine Ratio 0.10 0.00 - 0.15 mg/mg[Cre]  Type and screen [facility]     Status: None   Collection Time: 09/20/24 11:15 AM  Result Value Ref Range   ABO/RH(D) B POS    Antibody Screen NEG    Sample Expiration      09/23/2024,2359 Performed at [facility], [address] [place], [place] [address]     Imaging:  No results found.  MAU Course: Orders Placed This Encounter  Procedures   Culture, beta strep (group b only)   US  MFM OB COMP + 14 WK   US  MFM FETAL BPP WO NON STRESS   CBC   Protein / creatinine ratio, urine   ANA   Cmv antibody, IgG (EIA)   HSV 1 antibody, IgG   HSV 2 antibody, IgG   RPR   Rubella screen   Toxoplasma gondii antibody, IgG   Rapid urine drug screen (hospital performed)   Comprehensive metabolic panel with GFR   Lactate dehydrogenase  Uric acid   Diet regular Room service appropriate? Yes; Fluid consistency: Thin   Notify physician (specify)   Vital signs   Defer vaginal exam for vaginal bleeding or PROM <37 weeks   Apply Antepartum Care Plan   Initiate Oral Care Protocol   Initiate Carrier Fluid Protocol   Fetal monitoring   Patient may shower   Activity as tolerated   Notify physician (specify) Confirmatory reading of BP> 160/110 15 minutes later   Apply Hypertensive Disorders of Pregnancy Care Plan   Measure blood pressure   Strict intake and output   Full code    Consult to Maternal Fetal Care   Consult to neonatology Consult Timeframe: ROUTINE - requires response within 24 hours; Reason for Consult? severe IUGR   Type and screen [facility]   Place in observation (patient's expected length of stay will be less than 2 midnights)   Meds ordered this encounter  Medications   lactated ringers  infusion   acetaminophen (TYLENOL) tablet 650 mg   docusate sodium (COLACE) capsule 100 mg   calcium carbonate (TUMS - dosed in mg elemental calcium) chewable tablet 400 mg of elemental calcium   prenatal multivitamin tablet 1 tablet   betamethasone acetate-betamethasone sodium phosphate (CELESTONE) injection 12 mg   AND Linked Order Group    hydrALAZINE (APRESOLINE) injection 5 mg    hydrALAZINE (APRESOLINE) injection 10 mg    labetalol (NORMODYNE) injection 20 mg    labetalol (NORMODYNE) injection 40 mg   magnesium bolus via infusion 4 g   magnesium sulfate 40 grams in SWI 1000 mL OB infusion   lactated ringers  infusion   metoCLOPramide  (REGLAN ) tablet 10 mg   ondansetron  (ZOFRAN -ODT) disintegrating tablet 4 mg   famotidine  (PEPCID ) tablet 20 mg    Assessment/Plan: Elaine Montgomery is a 28 y.o. female, G1P0000, IUP at 30.3 weeks, presenting for admission for severe IUGR <3% with absent end flow diastolic dopplers. New on set PreEclampsia with SF R/T severe range BP (Bouts of bradycardia)  that required IV hydralazine and scotomata. Full Work up pending. H/O decreased eating habits BMI 19.9 at NOB, MJ usage during this pregnancy with hyperemesis gravidarum. 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 Denies vaginal leakage. Denies vaginal bleeding. Denies feeling cxt's.   FWB: Cat 2 Fetal Tracing.   Plan: Admit to [facility] for PreE W/SF and IUGR Routine Ante [facility] orders Pending Labs: cbc, pcr, cmp, tortch, USD, uric acid, LDH, ANA Start mag 4gm bolus with 2mg  maintenance with NS 75mls, iv hydralazine protocols, strict IO, seizure  precautions.  SCD Bed rest with Bathroom privilege MFM and NICU consults ordered and Mds aware.  BMZ 12mg  IM Q24H N/V: pepcid  20mg  PO  daily, zolfran 4mg  ODT PRN, regalen 10mg  PO TID PRN.    Jade Montana  CNM, FNP-C, PMHNP-BC  [address]  [place], [place] [address]  Cell: [phone]  Office Phone: [phone] Fax: [phone] 09/20/2024  12:51 PM    1800 labs and MFM consult reviewed.  Lengthy discussion with patient about .  Questions answered.  Stable currently on Mg and asymptomatic with occas ctx.  Neonatologist consulted as well.  Pt udergoing BMZ course.  Continuous monitoring for now and consider q shift once stable.  Pt understands she may need delivery prior to 34wks and will be in the hospital until delivery.

## 2024-09-20 NOTE — Plan of Care (Signed)
  Problem: Education: Goal: Knowledge of disease or condition will improve Outcome: Progressing Goal: Knowledge of the prescribed therapeutic regimen will improve Outcome: Progressing Goal: Individualized Educational Video(s) Outcome: Progressing   Problem: Clinical Measurements: Goal: Complications related to the disease process, condition or treatment will be avoided or minimized Outcome: Progressing   Problem: Education: Goal: Knowledge of General Education information will improve Description: Including pain rating scale, medication(s)/side effects and non-pharmacologic comfort measures Outcome: Progressing   Problem: Health Behavior/Discharge Planning: Goal: Ability to manage health-related needs will improve Outcome: Progressing   Problem: Clinical Measurements: Goal: Ability to maintain clinical measurements within normal limits will improve Outcome: Progressing Goal: Will remain free from infection Outcome: Progressing Goal: Diagnostic test results will improve Outcome: Progressing Goal: Respiratory complications will improve Outcome: Progressing Goal: Cardiovascular complication will be avoided Outcome: Progressing   Problem: Activity: Goal: Risk for activity intolerance will decrease Outcome: Progressing   Problem: Nutrition: Goal: Adequate nutrition will be maintained Outcome: Progressing   Problem: Coping: Goal: Level of anxiety will decrease Outcome: Progressing   Problem: Elimination: Goal: Will not experience complications related to bowel motility Outcome: Progressing Goal: Will not experience complications related to urinary retention Outcome: Progressing   Problem: Pain Managment: Goal: General experience of comfort will improve and/or be controlled Outcome: Progressing   Problem: Safety: Goal: Ability to remain free from injury will improve Outcome: Progressing   Problem: Skin Integrity: Goal: Risk for impaired skin integrity will  decrease Outcome: Progressing   Problem: Education: Goal: Knowledge of disease or condition will improve Outcome: Progressing Goal: Knowledge of the prescribed therapeutic regimen will improve Outcome: Progressing   Problem: Fluid Volume: Goal: Peripheral tissue perfusion will improve Outcome: Progressing   Problem: Clinical Measurements: Goal: Complications related to disease process, condition or treatment will be avoided or minimized Outcome: Progressing

## 2024-09-21 DIAGNOSIS — O365930 Maternal care for other known or suspected poor fetal growth, third trimester, not applicable or unspecified: Secondary | ICD-10-CM | POA: Diagnosis not present

## 2024-09-21 DIAGNOSIS — F1290 Cannabis use, unspecified, uncomplicated: Secondary | ICD-10-CM | POA: Diagnosis present

## 2024-09-21 DIAGNOSIS — O1414 Severe pre-eclampsia complicating childbirth: Secondary | ICD-10-CM | POA: Diagnosis not present

## 2024-09-21 DIAGNOSIS — Z148 Genetic carrier of other disease: Secondary | ICD-10-CM | POA: Diagnosis not present

## 2024-09-21 DIAGNOSIS — Z3A30 30 weeks gestation of pregnancy: Secondary | ICD-10-CM | POA: Diagnosis not present

## 2024-09-21 DIAGNOSIS — O99324 Drug use complicating childbirth: Secondary | ICD-10-CM | POA: Diagnosis present

## 2024-09-21 DIAGNOSIS — O365911 Maternal care for other known or suspected poor fetal growth, first trimester, fetus 1: Secondary | ICD-10-CM | POA: Diagnosis present

## 2024-09-21 LAB — CULTURE, OB URINE: Culture: NO GROWTH

## 2024-09-21 LAB — CMV ANTIBODY, IGG (EIA): CMV Ab - IgG: 4.4 U/mL — ABNORMAL HIGH (ref 0.00–0.59)

## 2024-09-21 LAB — HSV 2 ANTIBODY, IGG: HSV 2 Glycoprotein G Ab, IgG: NONREACTIVE

## 2024-09-21 LAB — COMPREHENSIVE METABOLIC PANEL WITH GFR
ALT: 14 U/L (ref 0–44)
AST: 21 U/L (ref 15–41)
Albumin: 2.9 g/dL — ABNORMAL LOW (ref 3.5–5.0)
Alkaline Phosphatase: 152 U/L — ABNORMAL HIGH (ref 38–126)
Anion gap: 12 (ref 5–15)
BUN: <5 mg/dL — ABNORMAL LOW (ref 6–20)
CO2: 20 mmol/L — ABNORMAL LOW (ref 22–32)
Calcium: 7.5 mg/dL — ABNORMAL LOW (ref 8.9–10.3)
Chloride: 99 mmol/L (ref 98–111)
Creatinine, Ser: 0.84 mg/dL (ref 0.44–1.00)
GFR, Estimated: >60 mL/min (ref >60)
Glucose, Bld: 99 mg/dL (ref 70–99)
Potassium: 3.8 mmol/L (ref 3.5–5.1)
Sodium: 131 mmol/L — ABNORMAL LOW (ref 135–145)
Total Bilirubin: 0.5 mg/dL (ref 0.0–1.2)
Total Protein: 6.5 g/dL (ref 6.5–8.1)

## 2024-09-21 LAB — MAGNESIUM
Magnesium: 8.4 mg/dL (ref 1.7–2.4)
Magnesium: 7.5 mg/dL (ref 1.7–2.4)
Magnesium: 4.9 mg/dL — ABNORMAL HIGH (ref 1.7–2.4)

## 2024-09-21 LAB — GC/CHLAMYDIA PROBE AMP (~~LOC~~) NOT AT ARMC
Chlamydia: NEGATIVE
Comment: NEGATIVE
Comment: NORMAL
Neisseria Gonorrhea: NEGATIVE

## 2024-09-21 LAB — HSV 1 ANTIBODY, IGG: HSV 1 Glycoprotein G Ab, IgG: NONREACTIVE

## 2024-09-21 LAB — TOXOPLASMA GONDII ANTIBODY, IGG: Toxoplasma IgG Ratio: <3 [IU]/mL (ref 0.0–7.1)

## 2024-09-21 LAB — RPR: RPR Ser Ql: NONREACTIVE

## 2024-09-21 LAB — ANA: Anti Nuclear Antibody (ANA): NEGATIVE

## 2024-09-21 LAB — RUBELLA SCREEN: Rubella: 4.2 {index} (ref >0.99)

## 2024-09-21 NOTE — Consult Note (Signed)
 MFM Note  Elaine Montgomery is currently at 30 weeks and 4 days.  She has been hospitalized due to severe IUGR with absent end-diastolic flow noted on umbilical artery Doppler studies.  She has received magnesium sulfate and a complete course of antenatal corticosteroids.  Her blood pressures overnight were in the 120s to 130s over 60s to 70s range.  Her PIH labs were all within normal limits.  She reports that she feels fine and denies any signs or symptoms of preeclampsia.  Due to the increased risk of a fetal demise associated with severe IUGR with absent end-diastolic flow, inpatient management with daily fetal testing until delivery is recommended.  We will continue the plan for daily fetal testing and twice weekly umbilical artery Doppler studies.  The goal for her delivery would be at around 34 weeks.    However, delivery prior to 34 weeks may be recommended for nonreassuring fetal status, abnormal PIH labs, severe range blood pressures, or reversed end-diastolic flow noted on her umbilical artery Doppler studies.    The patient stated that all of her questions were answered today.

## 2024-09-21 NOTE — Progress Notes (Addendum)
 Subjective:    Doing well, perceives slight FM, but unable to determine the difference between FM and tightening. Denies HA, visual changes, and RUQ pain. Feels better since magnesium has been off. Addressed POC questions RE: need for continuous EFM. Appetite is fair, but digestion is difficult w/ fetal monitor belts. Uses marijuana 2-3 times/day at home to increase appetite. Agrees to restart daily PPI.   Objective:    VS: BP 130/77 (BP Location: Left Arm)   Pulse 64   Temp 97.8 F (36.6 C) (Oral)   Resp 17   Ht 5' 3 (1.6 m)   Wt 52.2 kg   LMP 02/20/2024   SpO2 100%   BMI 20.37 kg/m  FHR: baseline 135 / variability minimal to moderate / accelerations N/A<32 wks / spontaneous variable w/ spontaneous return to baseline Toco: contractions none at this time  Membranes: intact Repeat VE deferred Last VE 09/20/24 @ 1438 Dilation: Fingertip Effacement (%): 80 Station: -2 Presentation: Undeterminable Exam by:: Jade Montana , NP  Constitutional:      Appearance: Normal appearance.  Cardiovascular:     Rate and Rhythm: Regular rhythm.   Pulmonary:     Effort: Pulmonary effort is normal.     Breath sounds: Normal breath sounds.  Abdominal:     General: , Soft, non-tender, bowel sounds are normal.  Genitourinary:    Comments: Uterus gravida less then dates Neurological:     1+DTR Psychiatric:        Mood and Affect: Mood normal.     Latest Ref Rng & Units 09/21/2024    2:08 AM 09/20/2024    1:09 PM 06/25/2024    4:57 PM  CMP  Glucose 70 - 99 mg/dL 99  80  92   BUN 6 - 20 mg/dL 5  7  6    Creatinine 0.44 - 1.00 mg/dL 9.15  9.05  9.17   Sodium 135 - 145 mmol/L 131  134  137   Potassium 3.5 - 5.1 mmol/L 3.8  3.9  3.9   Chloride 98 - 111 mmol/L 99  104  106   CO2 22 - 32 mmol/L 20  20  20    Calcium 8.9 - 10.3 mg/dL 7.5  8.6  9.4   Total Protein 6.5 - 8.1 g/dL 6.5  6.8  7.8   Total Bilirubin 0.0 - 1.2 mg/dL 0.5  0.7  1.1   Alkaline Phos 38 - 126 U/L 152  159  47   AST  15 - 41 U/L 21  21  21    ALT 0 - 44 U/L 14  14  13        Latest Ref Rng & Units 09/20/2024   10:30 AM 06/25/2024    4:57 PM 05/23/2024    1:51 PM  CBC  WBC 4.0 - 10.5 K/uL 5.4  8.4  7.8   Hemoglobin 12.0 - 15.0 g/dL 88.3  87.2  87.7   Hematocrit 36.0 - 46.0 % 32.2  37.1  35.5   Platelets 150 - 400 K/uL 221  364  345     Latest Reference Range & Units 09/21/24 00:20 09/21/24 02:08 09/21/24 07:57  Magnesium 1.7 - 2.4 mg/dL 8.4 (HH) 7.5 (HH) 4.9 (H)  (HH): Data is critically high (H): Data is abnormally high  Assessment/Plan:   28 y.o. G1P0 109w4d Severe IUGR >1% with absent end diastolic flow    -BMZ completed 09/21/24    -NICU consult completed    -Continuous EFM    -Fetal surveillance appropriate  for gestational age Pre-eclampsia w/ severe features (severe range BP)    -mag sulfate dc'd after 12 hrs, pt symptomatic, mag level 8.4    -normotensive and return to normal neuro baseline since mag was discontinued  MFM Recommendations: Inpatient management until delivery Continue magnesium sulfate until steroid course is completed Daily fetal testing  Twice-weekly weekly biophysical profile and umbilical artery Doppler studies Rescue course of corticosteroids as indicated Delivery at 34 weeks Delivery prior to 34 weeks would be indicated:             Should her blood pressures be persistently greater than 160/100             Should she require IV push medications for blood pressure control once she completes her steroid course             Should she complain of any signs and symptoms of severe preeclampsia             Should her preeclampsia labs show any abnormalities             At any time for nonreassuring fetal status             Should her umbilical artery Doppler studies show reversed end-diastolic flow Mercer KATHEE Peal DNP, CNM 09/21/2024 1:50 PM  Discussed POC again with pt.  Fetal tracing is borderline but given prematurity and growth restriction, it is somewhat  appropriate.  Will cont observe but if fetal decompensation or maternal indication arises, will deliver.

## 2024-09-22 ENCOUNTER — Encounter (HOSPITAL_COMMUNITY): Payer: Self-pay | Admitting: Obstetrics and Gynecology

## 2024-09-22 DIAGNOSIS — O368330 Maternal care for abnormalities of the fetal heart rate or rhythm, third trimester, not applicable or unspecified: Secondary | ICD-10-CM | POA: Diagnosis not present

## 2024-09-22 DIAGNOSIS — O1413 Severe pre-eclampsia, third trimester: Secondary | ICD-10-CM | POA: Diagnosis not present

## 2024-09-22 LAB — COMPREHENSIVE METABOLIC PANEL WITH GFR
ALT: 18 U/L (ref 0–44)
AST: 29 U/L (ref 15–41)
Albumin: 3 g/dL — ABNORMAL LOW (ref 3.5–5.0)
Alkaline Phosphatase: 146 U/L — ABNORMAL HIGH (ref 38–126)
Anion gap: 11 (ref 5–15)
BUN: 9 mg/dL (ref 6–20)
CO2: 20 mmol/L — ABNORMAL LOW (ref 22–32)
Calcium: 7.9 mg/dL — ABNORMAL LOW (ref 8.9–10.3)
Chloride: 103 mmol/L (ref 98–111)
Creatinine, Ser: 1.01 mg/dL — ABNORMAL HIGH (ref 0.44–1.00)
GFR, Estimated: >60 mL/min (ref >60)
Glucose, Bld: 90 mg/dL (ref 70–99)
Potassium: 4.3 mmol/L (ref 3.5–5.1)
Sodium: 134 mmol/L — ABNORMAL LOW (ref 135–145)
Total Bilirubin: 0.4 mg/dL (ref 0.0–1.2)
Total Protein: 6.5 g/dL (ref 6.5–8.1)

## 2024-09-22 LAB — CBC
HCT: 33.8 % — ABNORMAL LOW (ref 36.0–46.0)
Hemoglobin: 11.8 g/dL — ABNORMAL LOW (ref 12.0–15.0)
MCH: 32.2 pg (ref 26.0–34.0)
MCHC: 34.9 g/dL (ref 30.0–36.0)
MCV: 92.1 fL (ref 80.0–100.0)
Platelets: 298 K/uL (ref 150–400)
RBC: 3.67 MIL/uL — ABNORMAL LOW (ref 3.87–5.11)
RDW: 13.7 % (ref 11.5–15.5)
WBC: 8.5 K/uL (ref 4.0–10.5)
nRBC: 0 % (ref 0.0–0.2)

## 2024-09-22 LAB — LIPASE, BLOOD: Lipase: 30 U/L (ref 11–51)

## 2024-09-22 LAB — AMYLASE: Amylase: 72 U/L (ref 28–100)

## 2024-09-22 MED ORDER — LACTATED RINGERS IV SOLN: INTRAVENOUS | Status: DC

## 2024-09-22 MED ORDER — FAMOTIDINE 20 MG PO TABS
40.0000 mg | ORAL_TABLET | Freq: Every day | ORAL | Status: DC
  Administered 2024-09-22: 40 mg via ORAL
  Filled 2024-09-22: qty 2

## 2024-09-22 NOTE — Progress Notes (Signed)
 Called by RN, Alan, with report of decreased variability over the last Tracing reviewed remotely by me.  Baseline 130, min variability not much different than it has been since admission Will cont to observe for now Pt had not been getting any fluids for the over 24hrs and that was restarted this morning Creatinine noted to be 1.01 this morning Pt continues to have bouts of emesis, amylase and lipase added to morning labs.  LFTs wnl this morning BPs stable

## 2024-09-22 NOTE — Plan of Care (Signed)
  Problem: Education: Goal: Knowledge of disease or condition will improve Outcome: Progressing Goal: Knowledge of the prescribed therapeutic regimen will improve Outcome: Progressing Goal: Individualized Educational Video(s) Outcome: Progressing   Problem: Clinical Measurements: Goal: Complications related to the disease process, condition or treatment will be avoided or minimized Outcome: Progressing   Problem: Education: Goal: Knowledge of General Education information will improve Description: Including pain rating scale, medication(s)/side effects and non-pharmacologic comfort measures Outcome: Progressing   Problem: Health Behavior/Discharge Planning: Goal: Ability to manage health-related needs will improve Outcome: Progressing   Problem: Clinical Measurements: Goal: Ability to maintain clinical measurements within normal limits will improve Outcome: Progressing Goal: Will remain free from infection Outcome: Progressing Goal: Diagnostic test results will improve Outcome: Progressing Goal: Respiratory complications will improve Outcome: Progressing Goal: Cardiovascular complication will be avoided Outcome: Progressing   Problem: Activity: Goal: Risk for activity intolerance will decrease Outcome: Progressing   Problem: Nutrition: Goal: Adequate nutrition will be maintained Outcome: Progressing   Problem: Coping: Goal: Level of anxiety will decrease Outcome: Progressing   Problem: Elimination: Goal: Will not experience complications related to bowel motility Outcome: Progressing Goal: Will not experience complications related to urinary retention Outcome: Progressing   Problem: Pain Managment: Goal: General experience of comfort will improve and/or be controlled Outcome: Progressing   Problem: Safety: Goal: Ability to remain free from injury will improve Outcome: Progressing   Problem: Skin Integrity: Goal: Risk for impaired skin integrity will  decrease Outcome: Progressing   Problem: Education: Goal: Knowledge of disease or condition will improve Outcome: Progressing Goal: Knowledge of the prescribed therapeutic regimen will improve Outcome: Progressing   Problem: Fluid Volume: Goal: Peripheral tissue perfusion will improve Outcome: Progressing   Problem: Clinical Measurements: Goal: Complications related to disease process, condition or treatment will be avoided or minimized Outcome: Progressing

## 2024-09-22 NOTE — Progress Notes (Addendum)
 Subjective:    Doing well, denies neuro symptoms. Mother present and supportive. Brief review of POC and restarting IV fluids, denies questions.   Objective:    VS: BP 119/78 (BP Location: Right Arm)   Pulse 70   Temp 98.1 F (36.7 C) (Oral)   Resp 17   Ht 5' 3 (1.6 m)   Wt 52.2 kg   LMP 02/20/2024   SpO2 100%   BMI 20.37 kg/m  FHR: baseline 135 / variability minimal to moderate / accelerations absent / variable decelerations w/ spont return to baseline Toco: occasional contractions  Assessment/Plan:   28 y.o. G1P0 [redacted]w[redacted]d LOS: day 2   Severe IUGR >1% with absent end diastolic flow    -BMZ completed 09/21/24    -NICU consult completed    -Continuous EFM    -Fetal surveillance appropriate for gestational age Pre-eclampsia w/ severe features (severe range BP)    -mag sulfate dc'd after 12 hrs, pt symptomatic, mag level 8.4    -normotensive and return to normal neuro baseline since mag was discontinued   MFM Recommendations: Inpatient management until delivery Continue magnesium sulfate until steroid course is completed Daily fetal testing  Twice-weekly weekly biophysical profile and umbilical artery Doppler studies Rescue course of corticosteroids as indicated Delivery at 34 weeks Delivery prior to 34 weeks would be indicated:             Should her blood pressures be persistently greater than 160/100             Should she require IV push medications for blood pressure control once she completes her steroid course             Should she complain of any signs and symptoms of severe preeclampsia             Should her preeclampsia labs show any abnormalities             At any time for nonreassuring fetal status             Should her umbilical artery Doppler studies show reversed end-diastolic flow  Elaine KATHEE Peal DNP, CNM 09/22/2024 7:31 AM  Tracing reviewed by me.  Overall reassuring for gestational age.  Still with episodes of minimal variability and occas  variable/decel.  BPs stable and pt is asymptomatic.

## 2024-09-23 ENCOUNTER — Inpatient Hospital Stay (HOSPITAL_COMMUNITY): Admitting: Anesthesiology

## 2024-09-23 ENCOUNTER — Encounter (HOSPITAL_COMMUNITY): Payer: Self-pay

## 2024-09-23 ENCOUNTER — Encounter (HOSPITAL_COMMUNITY)
Admission: AD | Disposition: A | Discharge status: Home / Self Care | Payer: Self-pay | Source: Home / Self Care | Attending: Obstetrics and Gynecology

## 2024-09-23 DIAGNOSIS — Z3A30 30 weeks gestation of pregnancy: Secondary | ICD-10-CM | POA: Diagnosis not present

## 2024-09-23 DIAGNOSIS — O1414 Severe pre-eclampsia complicating childbirth: Secondary | ICD-10-CM | POA: Diagnosis not present

## 2024-09-23 DIAGNOSIS — O1413 Severe pre-eclampsia, third trimester: Secondary | ICD-10-CM | POA: Diagnosis not present

## 2024-09-23 DIAGNOSIS — O368330 Maternal care for abnormalities of the fetal heart rate or rhythm, third trimester, not applicable or unspecified: Secondary | ICD-10-CM | POA: Diagnosis not present

## 2024-09-23 DIAGNOSIS — O365930 Maternal care for other known or suspected poor fetal growth, third trimester, not applicable or unspecified: Secondary | ICD-10-CM | POA: Diagnosis not present

## 2024-09-23 DIAGNOSIS — O99324 Drug use complicating childbirth: Secondary | ICD-10-CM

## 2024-09-23 LAB — COMPREHENSIVE METABOLIC PANEL WITH GFR
ALT: 26 U/L (ref 0–44)
ALT: 26 U/L (ref 0–44)
AST: 37 U/L (ref 15–41)
AST: 44 U/L — ABNORMAL HIGH (ref 15–41)
Albumin: 2.9 g/dL — ABNORMAL LOW (ref 3.5–5.0)
Albumin: 2.8 g/dL — ABNORMAL LOW (ref 3.5–5.0)
Alkaline Phosphatase: 126 U/L (ref 38–126)
Alkaline Phosphatase: 102 U/L (ref 38–126)
Anion gap: 10 (ref 5–15)
Anion gap: 11 (ref 5–15)
BUN: 12 mg/dL (ref 6–20)
BUN: 13 mg/dL (ref 6–20)
CO2: 20 mmol/L — ABNORMAL LOW (ref 22–32)
CO2: 19 mmol/L — ABNORMAL LOW (ref 22–32)
Calcium: 8.4 mg/dL — ABNORMAL LOW (ref 8.9–10.3)
Calcium: 7.8 mg/dL — ABNORMAL LOW (ref 8.9–10.3)
Chloride: 105 mmol/L (ref 98–111)
Chloride: 99 mmol/L (ref 98–111)
Creatinine, Ser: 0.9 mg/dL (ref 0.44–1.00)
Creatinine, Ser: 1 mg/dL (ref 0.44–1.00)
GFR, Estimated: >60 mL/min (ref >60)
GFR, Estimated: >60 mL/min (ref >60)
Glucose, Bld: 75 mg/dL (ref 70–99)
Glucose, Bld: 105 mg/dL — ABNORMAL HIGH (ref 70–99)
Potassium: 4.3 mmol/L (ref 3.5–5.1)
Potassium: 4.4 mmol/L (ref 3.5–5.1)
Sodium: 135 mmol/L (ref 135–145)
Sodium: 129 mmol/L — ABNORMAL LOW (ref 135–145)
Total Bilirubin: 0.6 mg/dL (ref 0.0–1.2)
Total Bilirubin: 0.2 mg/dL (ref 0.0–1.2)
Total Protein: 6.2 g/dL — ABNORMAL LOW (ref 6.5–8.1)
Total Protein: 6 g/dL — ABNORMAL LOW (ref 6.5–8.1)

## 2024-09-23 LAB — CBC
HCT: 33.1 % — ABNORMAL LOW (ref 36.0–46.0)
HCT: 32.3 % — ABNORMAL LOW (ref 36.0–46.0)
Hemoglobin: 11.4 g/dL — ABNORMAL LOW (ref 12.0–15.0)
Hemoglobin: 11 g/dL — ABNORMAL LOW (ref 12.0–15.0)
MCH: 32.5 pg (ref 26.0–34.0)
MCH: 32.2 pg (ref 26.0–34.0)
MCHC: 34.4 g/dL (ref 30.0–36.0)
MCHC: 34.1 g/dL (ref 30.0–36.0)
MCV: 94.3 fL (ref 80.0–100.0)
MCV: 94.4 fL (ref 80.0–100.0)
Platelets: 287 K/uL (ref 150–400)
Platelets: 287 K/uL (ref 150–400)
RBC: 3.51 MIL/uL — ABNORMAL LOW (ref 3.87–5.11)
RBC: 3.42 MIL/uL — ABNORMAL LOW (ref 3.87–5.11)
RDW: 13.6 % (ref 11.5–15.5)
RDW: 13.4 % (ref 11.5–15.5)
WBC: 7.2 K/uL (ref 4.0–10.5)
WBC: 15.6 K/uL — ABNORMAL HIGH (ref 4.0–10.5)
nRBC: 0 % (ref 0.0–0.2)
nRBC: 0 % (ref 0.0–0.2)

## 2024-09-23 LAB — CULTURE, BETA STREP (GROUP B ONLY)

## 2024-09-23 LAB — TYPE AND SCREEN
ABO/RH(D): B POS
Antibody Screen: NEGATIVE

## 2024-09-23 LAB — MAGNESIUM: Magnesium: 4.7 mg/dL — ABNORMAL HIGH (ref 1.7–2.4)

## 2024-09-23 SURGERY — Surgical Case
Anesthesia: Spinal

## 2024-09-23 MED ORDER — ONDANSETRON HCL 4 MG/2ML IJ SOLN
INTRAMUSCULAR | Status: DC | PRN
  Administered 2024-09-23: 4 mg via INTRAVENOUS

## 2024-09-23 MED ORDER — CEFAZOLIN SODIUM-DEXTROSE 2-4 GM/100ML-% IV SOLN
2.0000 g | INTRAVENOUS | Status: AC
  Administered 2024-09-23: 2 g via INTRAVENOUS

## 2024-09-23 MED ORDER — MENTHOL 3 MG MT LOZG: 1.0000 | LOZENGE | OROMUCOSAL | Status: DC | PRN

## 2024-09-23 MED ORDER — FENTANYL CITRATE (PF) 100 MCG/2ML IJ SOLN: 25.0000 ug | INTRAMUSCULAR | Status: DC | PRN

## 2024-09-23 MED ORDER — FENTANYL CITRATE (PF) 100 MCG/2ML IJ SOLN
INTRAMUSCULAR | Status: AC
  Filled 2024-09-23: qty 2

## 2024-09-23 MED ORDER — KETOROLAC TROMETHAMINE 30 MG/ML IJ SOLN
30.0000 mg | Freq: Four times a day (QID) | INTRAMUSCULAR | Status: AC
  Administered 2024-09-23 – 2024-09-24 (×4): 30 mg via INTRAVENOUS
  Filled 2024-09-23 (×4): qty 1

## 2024-09-23 MED ORDER — STERILE WATER FOR IRRIGATION IR SOLN
Status: DC | PRN
  Administered 2024-09-23: 1

## 2024-09-23 MED ORDER — IBUPROFEN 600 MG PO TABS
600.0000 mg | ORAL_TABLET | Freq: Four times a day (QID) | ORAL | Status: DC
  Administered 2024-09-24 – 2024-09-25 (×3): 600 mg via ORAL
  Filled 2024-09-23 (×3): qty 1

## 2024-09-23 MED ORDER — PHENYLEPHRINE 80 MCG/ML (10ML) SYRINGE FOR IV PUSH (FOR BLOOD PRESSURE SUPPORT)
PREFILLED_SYRINGE | INTRAVENOUS | Status: DC | PRN
Start: 2024-09-23 — End: 2024-09-23
  Administered 2024-09-23: 80 ug via INTRAVENOUS

## 2024-09-23 MED ORDER — DIPHENHYDRAMINE HCL 25 MG PO CAPS
25.0000 mg | ORAL_CAPSULE | ORAL | Status: DC | PRN
  Filled 2024-09-23: qty 1

## 2024-09-23 MED ORDER — TRANEXAMIC ACID-NACL 1000-0.7 MG/100ML-% IV SOLN
INTRAVENOUS | Status: DC | PRN
Start: 2024-09-23 — End: 2024-09-23
  Administered 2024-09-23: 1000 mg via INTRAVENOUS

## 2024-09-23 MED ORDER — KETOROLAC TROMETHAMINE 30 MG/ML IJ SOLN: 30.0000 mg | Freq: Four times a day (QID) | INTRAMUSCULAR | Status: AC | PRN

## 2024-09-23 MED ORDER — DEXMEDETOMIDINE HCL IN NACL 80 MCG/20ML IV SOLN
INTRAVENOUS | Status: DC | PRN
Start: 2024-09-23 — End: 2024-09-23
  Administered 2024-09-23: 12 ug via INTRAVENOUS

## 2024-09-23 MED ORDER — BUPIVACAINE IN DEXTROSE 0.75-8.25 % IT SOLN
INTRATHECAL | Status: DC | PRN
Start: 2024-09-23 — End: 2024-09-23
  Administered 2024-09-23: 1.5 mL via INTRATHECAL

## 2024-09-23 MED ORDER — OXYTOCIN-SODIUM CHLORIDE 30-0.9 UT/500ML-% IV SOLN
2.5000 [IU]/h | INTRAVENOUS | Status: AC
  Administered 2024-09-23: 2.5 [IU]/h via INTRAVENOUS

## 2024-09-23 MED ORDER — SENNOSIDES-DOCUSATE SODIUM 8.6-50 MG PO TABS
2.0000 | ORAL_TABLET | Freq: Every day | ORAL | Status: DC
  Administered 2024-09-24 – 2024-09-25 (×2): 2 via ORAL
  Filled 2024-09-23 (×2): qty 2

## 2024-09-23 MED ORDER — PHENYLEPHRINE HCL-NACL 20-0.9 MG/250ML-% IV SOLN
INTRAVENOUS | Status: DC | PRN
Start: 2024-09-23 — End: 2024-09-23
  Administered 2024-09-23: 60 ug/min via INTRAVENOUS

## 2024-09-23 MED ORDER — MAGNESIUM SULFATE 40 GM/1000ML IV SOLN
1.0000 g/h | INTRAVENOUS | Status: AC
Start: 2024-09-23 — End: 2024-09-24
  Administered 2024-09-23: 1 g/h via INTRAVENOUS

## 2024-09-23 MED ORDER — MAGNESIUM SULFATE 40 GM/1000ML IV SOLN
1.0000 g/h | INTRAVENOUS | Status: DC
  Administered 2024-09-23: 1 g/h via INTRAVENOUS
  Filled 2024-09-23: qty 1000

## 2024-09-23 MED ORDER — WITCH HAZEL-GLYCERIN EX PADS: 1.0000 | MEDICATED_PAD | CUTANEOUS | Status: DC | PRN

## 2024-09-23 MED ORDER — LACTATED RINGERS IV SOLN: INTRAVENOUS | Status: DC

## 2024-09-23 MED ORDER — OXYCODONE HCL 5 MG PO TABS
5.0000 mg | ORAL_TABLET | ORAL | Status: DC | PRN
  Administered 2024-09-25 (×2): 5 mg via ORAL
  Filled 2024-09-23 (×2): qty 1

## 2024-09-23 MED ORDER — ACETAMINOPHEN 10 MG/ML IV SOLN: 1000.0000 mg | Freq: Once | INTRAVENOUS | Status: DC | PRN

## 2024-09-23 MED ORDER — SOD CITRATE-CITRIC ACID 500-334 MG/5ML PO SOLN
30.0000 mL | Freq: Once | ORAL | Status: AC
  Administered 2024-09-23: 30 mL via ORAL

## 2024-09-23 MED ORDER — SOD CITRATE-CITRIC ACID 500-334 MG/5ML PO SOLN
ORAL | Status: AC
  Filled 2024-09-23: qty 30

## 2024-09-23 MED ORDER — SODIUM CHLORIDE 0.9% FLUSH: 3.0000 mL | INTRAVENOUS | Status: DC | PRN

## 2024-09-23 MED ORDER — SIMETHICONE 80 MG PO CHEW
80.0000 mg | CHEWABLE_TABLET | ORAL | Status: DC | PRN
  Administered 2024-09-25: 80 mg via ORAL
  Filled 2024-09-23: qty 1

## 2024-09-23 MED ORDER — OXYCODONE HCL 5 MG/5ML PO SOLN: 5.0000 mg | Freq: Once | ORAL | Status: DC | PRN

## 2024-09-23 MED ORDER — ZOLPIDEM TARTRATE 5 MG PO TABS: 5.0000 mg | ORAL_TABLET | Freq: Every evening | ORAL | Status: DC | PRN

## 2024-09-23 MED ORDER — POVIDONE-IODINE 10 % EX SWAB
2.0000 | Freq: Once | CUTANEOUS | Status: DC
  Administered 2024-09-23: 2 via TOPICAL

## 2024-09-23 MED ORDER — SCOPOLAMINE 1 MG/3DAYS TD PT72
1.0000 | MEDICATED_PATCH | Freq: Once | TRANSDERMAL | Status: DC
  Administered 2024-09-23: 1 mg via TRANSDERMAL
  Filled 2024-09-23: qty 1

## 2024-09-23 MED ORDER — DIPHENHYDRAMINE HCL 25 MG PO CAPS
25.0000 mg | ORAL_CAPSULE | Freq: Four times a day (QID) | ORAL | Status: DC | PRN
  Administered 2024-09-23: 25 mg via ORAL

## 2024-09-23 MED ORDER — PRENATAL MULTIVITAMIN CH
1.0000 | ORAL_TABLET | Freq: Every day | ORAL | Status: DC
  Administered 2024-09-24: 1 via ORAL
  Filled 2024-09-23: qty 1

## 2024-09-23 MED ORDER — COCONUT OIL OIL: 1.0000 | TOPICAL_OIL | Status: DC | PRN

## 2024-09-23 MED ORDER — MAGNESIUM SULFATE BOLUS VIA INFUSION
2.0000 g | Freq: Once | INTRAVENOUS | Status: AC
  Administered 2024-09-23: 2 g via INTRAVENOUS
  Filled 2024-09-23: qty 1000

## 2024-09-23 MED ORDER — MEPERIDINE HCL 25 MG/ML IJ SOLN: 6.2500 mg | INTRAMUSCULAR | Status: DC | PRN

## 2024-09-23 MED ORDER — OXYCODONE HCL 5 MG PO TABS: 5.0000 mg | ORAL_TABLET | Freq: Once | ORAL | Status: DC | PRN

## 2024-09-23 MED ORDER — MORPHINE SULFATE (PF) 0.5 MG/ML IJ SOLN
INTRAMUSCULAR | Status: DC | PRN
  Administered 2024-09-23: 150 ug via INTRATHECAL

## 2024-09-23 MED ORDER — NALOXONE HCL 4 MG/10ML IJ SOLN: 1.0000 ug/kg/h | INTRAVENOUS | Status: DC | PRN

## 2024-09-23 MED ORDER — OXYTOCIN-SODIUM CHLORIDE 30-0.9 UT/500ML-% IV SOLN
INTRAVENOUS | Status: DC | PRN
  Administered 2024-09-23: 300 mL via INTRAVENOUS

## 2024-09-23 MED ORDER — ACETAMINOPHEN 10 MG/ML IV SOLN
INTRAVENOUS | Status: DC | PRN
  Administered 2024-09-23: 1000 mg via INTRAVENOUS

## 2024-09-23 MED ORDER — MAGNESIUM SULFATE 40 GM/1000ML IV SOLN
1.0000 g/h | INTRAVENOUS | Status: DC
  Administered 2024-09-23: 1 g/h via INTRAVENOUS

## 2024-09-23 MED ORDER — FENTANYL CITRATE (PF) 100 MCG/2ML IJ SOLN
INTRAMUSCULAR | Status: DC | PRN
  Administered 2024-09-23: 15 ug via INTRATHECAL

## 2024-09-23 MED ORDER — ONDANSETRON HCL 4 MG/2ML IJ SOLN: 4.0000 mg | Freq: Three times a day (TID) | INTRAMUSCULAR | Status: DC | PRN

## 2024-09-23 MED ORDER — DIBUCAINE (PERIANAL) 1 % EX OINT: 1.0000 | TOPICAL_OINTMENT | CUTANEOUS | Status: DC | PRN

## 2024-09-23 MED ORDER — ONDANSETRON HCL 4 MG/2ML IJ SOLN
INTRAMUSCULAR | Status: AC
  Filled 2024-09-23: qty 2

## 2024-09-23 MED ORDER — DROPERIDOL 2.5 MG/ML IJ SOLN: 0.6250 mg | Freq: Once | INTRAMUSCULAR | Status: DC | PRN

## 2024-09-23 MED ORDER — DEXAMETHASONE SOD PHOSPHATE PF 10 MG/ML IJ SOLN
INTRAMUSCULAR | Status: DC | PRN
  Administered 2024-09-23: 5 mg via INTRAVENOUS

## 2024-09-23 MED ORDER — SODIUM CHLORIDE 0.9 % IR SOLN
Status: DC | PRN
  Administered 2024-09-23: 1

## 2024-09-23 MED ORDER — MORPHINE SULFATE (PF) 0.5 MG/ML IJ SOLN
INTRAMUSCULAR | Status: AC
  Filled 2024-09-23: qty 10

## 2024-09-23 MED ORDER — SIMETHICONE 80 MG PO CHEW
80.0000 mg | CHEWABLE_TABLET | Freq: Three times a day (TID) | ORAL | Status: DC
  Administered 2024-09-23 – 2024-09-25 (×5): 80 mg via ORAL
  Filled 2024-09-23 (×5): qty 1

## 2024-09-23 MED ORDER — DIPHENHYDRAMINE HCL 50 MG/ML IJ SOLN: 12.5000 mg | INTRAMUSCULAR | Status: DC | PRN

## 2024-09-23 MED ORDER — ACETAMINOPHEN 500 MG PO TABS
1000.0000 mg | ORAL_TABLET | Freq: Four times a day (QID) | ORAL | Status: DC
  Administered 2024-09-23 – 2024-09-25 (×7): 1000 mg via ORAL
  Filled 2024-09-23 (×7): qty 2

## 2024-09-23 MED ORDER — NALOXONE HCL 0.4 MG/ML IJ SOLN: 0.4000 mg | INTRAMUSCULAR | Status: DC | PRN

## 2024-09-23 SURGICAL SUPPLY — 32 items
BENZOIN TINCTURE PRP APPL 2/3 (GAUZE/BANDAGES/DRESSINGS) ×1 IMPLANT
CHLORAPREP W/TINT 26 (MISCELLANEOUS) ×2 IMPLANT
CLAMP UMBILICAL CORD (MISCELLANEOUS) ×1 IMPLANT
CLOTH BEACON ORANGE TIMEOUT ST (SAFETY) ×1 IMPLANT
DRSG OPSITE POSTOP 4X10 (GAUZE/BANDAGES/DRESSINGS) ×1 IMPLANT
ELECTRODE REM PT RTRN 9FT ADLT (ELECTROSURGICAL) ×1 IMPLANT
EXTRACTOR VACUUM M CUP 4 TUBE (SUCTIONS) IMPLANT
GAUZE PAD ABD 7.5X8 STRL (GAUZE/BANDAGES/DRESSINGS) IMPLANT
GAUZE SPONGE 4X4 12PLY STRL LF (GAUZE/BANDAGES/DRESSINGS) IMPLANT
GLOVE BIO SURGEON STRL SZ7.5 (GLOVE) ×1 IMPLANT
GLOVE BIOGEL PI IND STRL 7.0 (GLOVE) ×1 IMPLANT
GLOVE BIOGEL PI IND STRL 7.5 (GLOVE) ×1 IMPLANT
GOWN STRL REUS W/TWL LRG LVL3 (GOWN DISPOSABLE) ×2 IMPLANT
KIT ABG SYR 3ML LUER SLIP (SYRINGE) IMPLANT
NDL HYPO 25X5/8 SAFETYGLIDE (NEEDLE) IMPLANT
NEEDLE HYPO 25X5/8 SAFETYGLIDE (NEEDLE) IMPLANT
NS IRRIG 1000ML POUR BTL (IV SOLUTION) ×1 IMPLANT
PACK C SECTION WH (CUSTOM PROCEDURE TRAY) ×1 IMPLANT
PAD OB MATERNITY 4.3X12.25 (PERSONAL CARE ITEMS) ×1 IMPLANT
RTRCTR C-SECT PINK 25CM LRG (MISCELLANEOUS) ×1 IMPLANT
STRIP CLOSURE SKIN 1/2X4 (GAUZE/BANDAGES/DRESSINGS) ×1 IMPLANT
SUT CHROMIC 2 0 CT 1 (SUTURE) ×1 IMPLANT
SUT MNCRL 0 VIOLET CTX 36 (SUTURE) ×1 IMPLANT
SUT MNCRL AB 3-0 PS2 27 (SUTURE) ×1 IMPLANT
SUT VIC AB 0 CT1 36 (SUTURE) ×1 IMPLANT
SUT VIC AB 0 CTX36XBRD ANBCTRL (SUTURE) ×3 IMPLANT
SUT VIC AB 2-0 SH 27XBRD (SUTURE) IMPLANT
SUTURE PLAIN GUT 2.0 ETHICON (SUTURE) ×1 IMPLANT
TAPE CLOTH SURG 4X10 WHT LF (GAUZE/BANDAGES/DRESSINGS) IMPLANT
TOWEL OR 17X24 6PK STRL BLUE (TOWEL DISPOSABLE) ×1 IMPLANT
TRAY FOLEY W/BAG SLVR 14FR LF (SET/KITS/TRAYS/PACK) ×1 IMPLANT
WATER STERILE IRR 1000ML POUR (IV SOLUTION) ×1 IMPLANT

## 2024-09-23 NOTE — Anesthesia Preprocedure Evaluation (Addendum)
 Anesthesia Evaluation  Patient identified by MRN, date of birth, ID band Patient awake    Reviewed: Allergy & Precautions, NPO status , Patient's Chart, lab work & pertinent test results  Airway Mallampati: I  TM Distance: >3 FB Neck ROM: Full    Dental  (+) Teeth Intact, Dental Advisory Given   Pulmonary    breath sounds clear to auscultation       Cardiovascular negative cardio ROS  Rhythm:Regular Rate:Normal     Neuro/Psych  Headaches    GI/Hepatic negative GI ROS, Neg liver ROS,,,  Endo/Other  negative endocrine ROS    Renal/GU negative Renal ROS     Musculoskeletal   Abdominal   Peds  Hematology   Anesthesia Other Findings   Reproductive/Obstetrics (+) Pregnancy                              Anesthesia Physical Anesthesia Plan  ASA: 2 and emergent  Anesthesia Plan: Spinal   Post-op Pain Management: Ofirmev IV (intra-op)*   Induction: Intravenous  PONV Risk Score and Plan: 3 and Ondansetron  and Treatment may vary due to age or medical condition  Airway Management Planned: Natural Airway  Additional Equipment: None  Intra-op Plan:   Post-operative Plan:   Informed Consent: I have reviewed the patients History and Physical, chart, labs and discussed the procedure including the risks, benefits and alternatives for the proposed anesthesia with the patient or authorized representative who has indicated his/her understanding and acceptance.       Plan Discussed with: CRNA  Anesthesia Plan Comments: (Lab Results      Component                Value               Date                      WBC                      7.2                 09/23/2024                HGB                      11.4 (L)            09/23/2024                HCT                      33.1 (L)            09/23/2024                MCV                      94.3                09/23/2024                PLT                       287                 09/23/2024           )  Anesthesia Quick Evaluation

## 2024-09-23 NOTE — Plan of Care (Signed)
  Problem: Education: Goal: Knowledge of disease or condition will improve Outcome: Progressing Goal: Knowledge of the prescribed therapeutic regimen will improve Outcome: Progressing Goal: Individualized Educational Video(s) Outcome: Progressing   Problem: Clinical Measurements: Goal: Complications related to the disease process, condition or treatment will be avoided or minimized Outcome: Progressing   Problem: Education: Goal: Knowledge of General Education information will improve Description: Including pain rating scale, medication(s)/side effects and non-pharmacologic comfort measures Outcome: Progressing   Problem: Health Behavior/Discharge Planning: Goal: Ability to manage health-related needs will improve Outcome: Progressing   Problem: Clinical Measurements: Goal: Ability to maintain clinical measurements within normal limits will improve Outcome: Progressing Goal: Will remain free from infection Outcome: Progressing Goal: Diagnostic test results will improve Outcome: Progressing Goal: Respiratory complications will improve Outcome: Progressing Goal: Cardiovascular complication will be avoided Outcome: Progressing   Problem: Activity: Goal: Risk for activity intolerance will decrease Outcome: Progressing   Problem: Nutrition: Goal: Adequate nutrition will be maintained Outcome: Progressing   Problem: Coping: Goal: Level of anxiety will decrease Outcome: Progressing   Problem: Elimination: Goal: Will not experience complications related to bowel motility Outcome: Progressing Goal: Will not experience complications related to urinary retention Outcome: Progressing   Problem: Pain Managment: Goal: General experience of comfort will improve and/or be controlled Outcome: Progressing   Problem: Safety: Goal: Ability to remain free from injury will improve Outcome: Progressing   Problem: Skin Integrity: Goal: Risk for impaired skin integrity will  decrease Outcome: Progressing   Problem: Education: Goal: Knowledge of disease or condition will improve Outcome: Progressing Goal: Knowledge of the prescribed therapeutic regimen will improve Outcome: Progressing   Problem: Fluid Volume: Goal: Peripheral tissue perfusion will improve Outcome: Progressing   Problem: Clinical Measurements: Goal: Complications related to disease process, condition or treatment will be avoided or minimized Outcome: Progressing

## 2024-09-23 NOTE — Progress Notes (Signed)
 Patient tolerating PO. Patient ambulated in hallway independently x1 lap. Tolerated well. Peri care performed independently. Minimal lochia noted.

## 2024-09-23 NOTE — Transfer of Care (Signed)
 Immediate Anesthesia Transfer of Care Note  Patient: Elaine Montgomery  Procedure(s) Performed: CESAREAN DELIVERY  Patient Location: PACU  Anesthesia Type:Spinal  Level of Consciousness: awake, alert , and oriented  Airway & Oxygen Therapy: Patient Spontanous Breathing  Post-op Assessment: Report given to RN and Post -op Vital signs reviewed and stable  Post vital signs: Reviewed and stable  Last Vitals:  Vitals Value Taken Time  BP 140/96 09/23/24 14:35  Temp    Pulse 57 09/23/24 14:39  Resp 18 09/23/24 14:39  SpO2 100 % 09/23/24 14:39  Vitals shown include unfiled device data.  Last Pain:  Vitals:   09/23/24 1225  TempSrc: Oral  PainSc:          Complications: No notable events documented.

## 2024-09-23 NOTE — Op Note (Signed)
 Cesarean Section Procedure Note  Indications: 28yo G1P0 at 21 6/7wks who is being sectioned urgently d/t progressive frequency of prolonged decels in severely growth restricted baby.  Pre-operative Diagnosis: 1.30 6/7wks 2.Severe IUGR 3.Pre-E with Severe Features 4.Recurrent Prolonged Decelerations   Post-operative Diagnosis: 1.30 6/7wks 2.Severe IUGR 3.Pre-E with Severe Features 4.Recurrent Prolonged Decelerations  Procedure: Primary Low Transverse Cesarean Section  Surgeon: Henry Slough, MD    Assistants: Daved Maier, MD (OB Fellow)  Anesthesia: Spinal  Anesthesiologist: Tilford Franky BIRCH, MD   Procedure Details  The patient was taken to the operating room secondary to fetal compromise after the risks, benefits, complications, treatment options, and expected outcomes were discussed with the patient.  The patient concurred with the proposed plan, giving informed consent which was signed and witnessed. The patient was taken to Operating Room B, identified as Elaine Montgomery and the procedure verified as C-Section Delivery. A Time Out was held and the above information confirmed.  After induction of anesthesia by obtaining a spinal, the patient was prepped and draped in the usual sterile manner. A Pfannenstiel skin incision was made and carried down through the subcutaneous tissue to the underlying layer of fascia.  The fascia was incised bilaterally and extended transversely bilaterally with the Mayo scissors. Kocher clamps were placed on the inferior aspect of the fascial incision and the underlying rectus muscle was separated from the fascia. The same was done on the superior aspect of the fascial incision.  The peritoneum was identified, entered bluntly and extended manually.  An Alexis self-retaining retractor was placed.  The utero-vesical peritoneal reflection was incised transversely and the bladder flap was bluntly freed from the lower uterine segment. A low transverse uterine  incision was made with the scalpel and extended bilaterally with the bandage scissors.  The infant was delivered in vertex position without difficulty but was found to be low in the pelvis and needed elevation up to the incision.  After the umbilical cord was clamped and cut, the infant was handed to the awaiting pediatricians.  Cord blood was obtained for evaluation as well as cord gases (collected by circulating RN).  The placenta was removed intact and appeared to be within normal limits. The uterus was cleared of all clots and debris. The uterine incision was closed with running interlocking sutures of 0 Vicryl and a second imbricating layer was performed as well with 2-0 monocryl.   Bilateral tubes and ovaries appeared to be within normal limits.  Good hemostasis was noted.  Copious irrigation was performed until clear.  The peritoneum was repaired with 2-0 chromic via a running suture.  The fascia was reapproximated with a running suture of 0 Vicryl.  The skin was reapproximated with a subcuticular suture of 3-0 monocryl.  Steristrips were applied.  Instrument, sponge, and needle counts were correct prior to abdominal closure and at the conclusion of the case.  The patient was awaiting transfer to the recovery room in good condition.  Findings: Live female infant with Apgars officially pending but possible prelim report obtained from circulating RN  8 at one minute and 9 at five minutes.  The baby was attended to by NICU team and taken to NICU in isolette.  Normal appearing bilateral ovaries and fallopian tubes were noted.  Estimated Blood Loss:  312 ml         Drains: 300 cc         Total IV Fluids: 1700 ml         Specimens to  Pathology: Placenta         Complications:  None; patient tolerated the procedure well.         Disposition: PACU - hemodynamically stable.         Condition: stable  Attending Attestation: I performed the procedure.  I was present and scrubbed and the assistant  was required due to complexity of anatomy.

## 2024-09-23 NOTE — Lactation Note (Signed)
 This note was copied from a baby's chart.  NICU Lactation Consultation Note  Patient Name: Elaine Montgomery Today's Date: 09/23/2024 Age:26 hours  Reason for consult: Initial assessment; NICU baby; Primapara; 1st time breastfeeding; Preterm <34wks; Infant < 5lbs; Other (Comment) (pre-eclampsia on MgSO4, IUGR)  SUBJECTIVE  LC in to visit with P1 Mom of preterm infant Elaine Montgomery who was delivered by urgent C/S for fetal decels and IUGR.   Baby admitted to the NICU for prematurity and respiratory support.  Mom willing and excited to initiate breast stimulation with double pumping.  LC sized Mom with 18 mm flanges and assisted her to pump on initiation setting.  Each breast produced a drop onto nipple.  Mom to dab it into the nipple explaining it's benefit.  Coconut oil also provided for lubrication prn.  LC set up washing and drying basins and provided the CDC guidelines for washing and cleaning of pump parts.    Plan recommended and written on dry erase board- 1- STS with baby when able to 2- Massage breast and hand express often (demonstrated this) 3- Pump both breasts on initiation setting, goal is 8 times per 24 hrs.   4- ask for help prn.  OBJECTIVE Infant data: No data recorded O2 Device: CPAP FiO2 (%): 21 %  Infant feeding assessment No data recorded  Maternal data: G1P0 C-Section, Low Transverse Has patient been taught Hand Expression?: Yes Hand Expression Comments: no colostrum presently expressed Significant Breast History:: ++ breast changes Current breast feeding challenges:: [redacted]w[redacted]d infant admitted to NICU Does the patient have breastfeeding experience prior to this delivery?: No Pumping frequency: Initiated double pumping at 2 hrs post delivery of infant Pumped volume: 0 mL (drop) Flange Size: 18 Risk factor for low/delayed milk supply:: Infant separation  WIC Program: Yes WIC Referral Sent?: Yes What county?: [place]  ASSESSMENT Infant:  No data  recorded Maternal: Milk volume: Normal  INTERVENTIONS/PLAN Interventions: Interventions: Breast feeding basics reviewed; Skin to skin; Breast massage; Hand express; Coconut oil; DEBP; Education; [employer] Services brochure; CDC milk storage guidelines; NICU Pumping Log Tools: Pump; Flanges; Coconut oil Pump Education: Setup, frequency, and cleaning; Milk Storage  Plan: Consult Status: NICU follow-up NICU Follow-up type: New admission follow up   Claudene Aleck BRAVO 09/23/2024, 5:13 PM

## 2024-09-23 NOTE — Anesthesia Postprocedure Evaluation (Signed)
 Anesthesia Post Note  Patient: Elaine Montgomery  Procedure(s) Performed: CESAREAN DELIVERY     Patient location during evaluation: PACU Anesthesia Type: Spinal Level of consciousness: oriented and awake and alert Pain management: pain level controlled Vital Signs Assessment: post-procedure vital signs reviewed and stable Respiratory status: spontaneous breathing, respiratory function stable and patient connected to nasal cannula oxygen Cardiovascular status: blood pressure returned to baseline and stable Postop Assessment: no headache, no backache and no apparent nausea or vomiting Anesthetic complications: no   No notable events documented.  Last Vitals:  Vitals:   09/23/24 1652 09/23/24 1738  BP: (!) 128/90 (!) 135/91  Pulse: (!) 58 (!) 53  Resp: 16   Temp: 36.8 C   SpO2:      Last Pain:  Vitals:   09/23/24 1802  TempSrc:   PainSc: 0-No pain   Pain Goal:                   Franky JONETTA Bald

## 2024-09-23 NOTE — Progress Notes (Signed)
 Tracing reviewed remotely by me after call from RN with prolonged decel for about with recovery Currently baseline 140 with min-mod variability Will cont to observe Pt has repeat BPP and dopplers tomorrow

## 2024-09-23 NOTE — Progress Notes (Signed)
 Called by Dr. Ileana who rec restarting mg for neuroprophylaxis and then delivery by c-section that pt has had another prolonged decel since this morning.  Will make NPO and plan for c-section today.

## 2024-09-23 NOTE — Progress Notes (Signed)
 Pt had another prolonged decel down to the 90s.  I was notified by RN.  Length of time uncertain d/t trying to get heart beat but decel audible to Gonzalez, CHARITY FUNDRAISER.  I discussed plan with the patient.  She is ok proceeding with a c-section given the status of baby with severe FGR and unlikelihood of tolerating labor.  The risks benefits and alternatives including but not limited to bleeding infection and injury were reviewed, questions answered and consent signed and witnessed.  I will proceed urgently with c-section now d/t the repetitive nature of the prolonged decels in a severely growth restricted baby.

## 2024-09-23 NOTE — Anesthesia Procedure Notes (Signed)
 Spinal  Start time: 09/23/2024 12:59 PM End time: 09/23/2024 1:01 PM Reason for block: surgical anesthesia Staffing Performed: anesthesiologist  Anesthesiologist: Tilford Franky BIRCH, MD Performed by: Tilford Franky BIRCH, MD Authorized by: Tilford Franky BIRCH, MD   Preanesthetic Checklist Completed: patient identified, IV checked, site marked, risks and benefits discussed, surgical consent, monitors and equipment checked, pre-op evaluation and timeout performed Spinal Block Patient position: sitting Prep: DuraPrep and site prepped and draped Location: L3-4 Injection technique: single-shot Needle Needle type: Pencan  Needle gauge: 24 G Needle length: 10 cm Needle insertion depth: 10 cm Additional Notes Patient tolerated well. No immediate complications.  Pre-FHR: 148  Post- FHR: 154  Functioning IV was confirmed and monitors were applied. Sterile prep and drape, including hand hygiene and sterile gloves were used. The patient was positioned and the back was prepped. The skin was anesthetized with lidocaine. Free flow of clear CSF was obtained prior to injecting local anesthetic into the CSF. The spinal needle aspirated freely following injection. The needle was carefully withdrawn. The patient tolerated the procedure well.

## 2024-09-23 NOTE — Consult Note (Addendum)
 MFM Consult Note  Elaine Montgomery is currently at 30 weeks and 6 days.  She has been hospitalized due to severe IUGR with absent end-diastolic flow noted on umbilical artery Doppler studies and possible preeclampsia.    The patient has received a complete course of antenatal corticosteroids.  Her PIH labs have remained stable and her blood pressures remain in the 120s to 130s over 70s to 80s range.  The patient is currently asymptomatic.  Her fetal heart rate tracing continues to show minimal variability.  She has had 2 prolonged decelerations lasting about 3 minutes each over the last 3 to 4 hours.  Due to severe IUGR with abnormal umbilical artery Doppler studies and her recurrent decelerations, indicating severe placental dysfunction, delivery is recommended.    Due to the extremely small fetal size (EFW 1 pound 12 ounces), I would recommend that she be started on magnesium sulfate for fetal neuroprotection.    As the patient had a very high magnesium level when she initially received magnesium sulfate on admission, we will restart magnesium with a 2 g load followed by 1 g every hour until delivery.  I discussed the reasoning for delivery today with the patient.  She is excited and happy to proceed with delivery via C-section later today.  She stated that all of her questions were answered.  A total of 35 minutes was spent counseling and coordinating the care for this patient.

## 2024-09-24 ENCOUNTER — Encounter (HOSPITAL_COMMUNITY): Payer: Self-pay | Admitting: Obstetrics and Gynecology

## 2024-09-24 DIAGNOSIS — O1490 Unspecified pre-eclampsia, unspecified trimester: Secondary | ICD-10-CM | POA: Diagnosis present

## 2024-09-24 DIAGNOSIS — Z98891 History of uterine scar from previous surgery: Secondary | ICD-10-CM

## 2024-09-24 LAB — COMPREHENSIVE METABOLIC PANEL WITH GFR
ALT: 22 U/L (ref 0–44)
AST: 38 U/L (ref 15–41)
Albumin: 2.8 g/dL — ABNORMAL LOW (ref 3.5–5.0)
Alkaline Phosphatase: 109 U/L (ref 38–126)
Anion gap: 10 (ref 5–15)
BUN: 13 mg/dL (ref 6–20)
CO2: 19 mmol/L — ABNORMAL LOW (ref 22–32)
Calcium: 7.6 mg/dL — ABNORMAL LOW (ref 8.9–10.3)
Chloride: 102 mmol/L (ref 98–111)
Creatinine, Ser: 1 mg/dL (ref 0.44–1.00)
GFR, Estimated: >60 mL/min (ref >60)
Glucose, Bld: 76 mg/dL (ref 70–99)
Potassium: 4.5 mmol/L (ref 3.5–5.1)
Sodium: 131 mmol/L — ABNORMAL LOW (ref 135–145)
Total Bilirubin: 0.5 mg/dL (ref 0.0–1.2)
Total Protein: 5.9 g/dL — ABNORMAL LOW (ref 6.5–8.1)

## 2024-09-24 LAB — CBC
HCT: 31.6 % — ABNORMAL LOW (ref 36.0–46.0)
Hemoglobin: 10.9 g/dL — ABNORMAL LOW (ref 12.0–15.0)
MCH: 32.6 pg (ref 26.0–34.0)
MCHC: 34.5 g/dL (ref 30.0–36.0)
MCV: 94.6 fL (ref 80.0–100.0)
Platelets: 258 K/uL (ref 150–400)
RBC: 3.34 MIL/uL — ABNORMAL LOW (ref 3.87–5.11)
RDW: 13.4 % (ref 11.5–15.5)
WBC: 17.5 K/uL — ABNORMAL HIGH (ref 4.0–10.5)
nRBC: 0 % (ref 0.0–0.2)

## 2024-09-24 LAB — MAGNESIUM: Magnesium: 5.7 mg/dL — ABNORMAL HIGH (ref 1.7–2.4)

## 2024-09-24 MED ORDER — LACTATED RINGERS IV SOLN: INTRAVENOUS | Status: AC

## 2024-09-24 MED ORDER — FAMOTIDINE 20 MG PO TABS
40.0000 mg | ORAL_TABLET | Freq: Every day | ORAL | Status: DC
  Administered 2024-09-24 – 2024-09-25 (×2): 40 mg via ORAL
  Filled 2024-09-24 (×2): qty 2

## 2024-09-24 NOTE — Progress Notes (Signed)
 Subjective: POD# 1 Live born female  Birth Weight: 2 lb 0.8 oz (930 g) APGAR: 8, 9  Newborn Delivery   Birth date/time: 09/23/2024 13:26:00 Delivery type: C-Section, Low Transverse Trial of labor: No C-section categorization: Primary    Baby name: [name]  Delivering provider: HENRY SLOUGH  Feeding: Baby in NICU  Pain control at delivery: Spinal  Reports feeling well. Denies PEC symptoms. Denies pain. Has been to see baby in the NICU.   Patient reports tolerating PO.   Pain controlled with acetaminophen and ibuprofen (OTC) Denies HA/SOB/C/P/N/V/dizziness. She reports vaginal bleeding as normal, without clots. She is ambulating and urinating without difficulty.     Objective:  Vitals:   09/24/24 1144 09/24/24 1217 09/24/24 1408 09/24/24 1443  BP:  127/89    Pulse:  (!) 50    Resp: 17 17 16 17   Temp:  97.8 F (36.6 C)    TempSrc:  Oral    SpO2:  97%    Weight:      Height:       Intake/Output Summary (Last 24 hours) at 09/24/2024 1510 Last data filed at 09/24/2024 1443 Gross per 24 hour  Intake 4024.54 ml  Output 1950 ml  Net 2074.54 ml     Recent Labs    09/23/24 2040 09/24/24 0426  WBC 15.6* 17.5*  HGB 11.0* 10.9*  HCT 32.3* 31.6*  PLT 287 258    Blood type: --/--/B POS (11/02 1441)  Rubella: 4.20 (10/30 1115)   Physical Exam:  General: alert and cooperative CV: Regular rate and rhythm Resp: clear Abdomen: soft, nontender, normal bowel sounds Incision: clean, dry, and intact Uterine Fundus: firm, below umbilicus, nontender Lochia: minimal Ext: extremities normal, atraumatic, no cyanosis or edema  Assessment/Plan: 28 y.o.   POD# 1. G1P0                  Principal Problem:   Postpartum care following cesarean delivery 11/2  Encourage to ambulate Routine post-op care Active Problems:   IUGR (intrauterine growth restriction) affecting care of mother, third trimester, fetus 1   Preeclampsia  Plan to turn off magnesium sulfate now, was at  1gm/hr  BPs WNL  Labs stable, AST down to 38  Denies PEC symptoms  Continue monitoring BPs   Status post primary low transverse cesarean section  Continue current care. Anticipate discharge on PP day #3.             [name], CNM, MSN 09/24/2024, 3:10 PM

## 2024-09-24 NOTE — Social Work (Signed)
 CSW acknowledged consult. MOB currently receiving magnesium infusion until 1544 . CSW agreed to meet with MOB at a later time.    Nat Quiet, MSW, LCSW Clinical Social Worker  [phone] 09/24/2024  2:20 PM

## 2024-09-24 NOTE — Lactation Note (Signed)
 This note was copied from a baby's chart.  NICU Lactation Consultation Note  Patient Name: Elaine Montgomery Date: 09/24/2024 Age:26 hours  Reason for consult: Follow-up assessment; Primapara; 1st time breastfeeding; NICU baby; Preterm <34wks; Infant < 5lbs; Other (Comment) (IUGR, pre-eclampsia)  SUBJECTIVE LC in to visit with P1 Mom of baby Elaine Montgomery born at [redacted]w[redacted]d and is in the NICU.   Mom states she has pumped 3 times and seeing drops.   LC provided a pumping top and assisted her to pump on initiation setting.  Reassured Mom that it can take 2-5 days for milk to come to volume and more frequent pumping will support a full milk supply Mom heard from [facility] and has an appointment for Thursday 11/6.  Mom aware of [facility] loaner pump available for a $30 refundable deposit, on discharge.  Mom to ask for LC prn.  OBJECTIVE Infant data: No data recorded O2 Device: CPAP FiO2 (%): 21 %  Infant feeding assessment No data recorded  Maternal data: G1P0 C-Section, Low Transverse Has patient been taught Hand Expression?: Yes Hand Expression Comments: no colostrum presently expressed Significant Breast History:: ++ breast changes Current breast feeding challenges:: [redacted]w[redacted]d infant admitted to NICU Does the patient have breastfeeding experience prior to this delivery?: No Pumping frequency: Mom has pumped 3 times, encouraged pumping more often to support a full milk supply Pumped volume: 0 mL (drops) Flange Size: 18 Hands-free pumping top sizes: Small/Medium (Blue) Risk factor for low/delayed milk supply:: Infant separation  WIC Program: Yes WIC Referral Sent?: Yes What county?: [place] Pump:  (Mom desires of WIC loaner on discharge)  ASSESSMENT Infant:  Feeding Status: NPO  Maternal: Milk volume: Normal  INTERVENTIONS/PLAN Interventions: Interventions: Breast feeding basics reviewed; Skin to skin; Breast massage; Hand express; DEBP; Education Tools: Pump; Flanges; Hands-free  pumping top Pump Education: Setup, frequency, and cleaning; Milk Storage  Plan: Consult Status: NICU follow-up NICU Follow-up type: Verify onset of copious milk; Verify absence of engorgement; Verify DEBP issuance   Claudene Aleck BRAVO 09/24/2024, 4:10 PM

## 2024-09-25 ENCOUNTER — Other Ambulatory Visit (HOSPITAL_COMMUNITY): Payer: Self-pay

## 2024-09-25 LAB — SURGICAL PATHOLOGY

## 2024-09-25 MED ORDER — ACETAMINOPHEN 500 MG PO TABS
1000.0000 mg | ORAL_TABLET | Freq: Four times a day (QID) | ORAL | 0 refills | Status: DC
  Filled 2024-09-25: qty 30, 4d supply, fill #0

## 2024-09-25 MED ORDER — OXYCODONE HCL 5 MG PO TABS
5.0000 mg | ORAL_TABLET | ORAL | 0 refills | Status: DC | PRN
  Filled 2024-09-25: qty 15, 3d supply, fill #0

## 2024-09-25 MED ORDER — IBUPROFEN 600 MG PO TABS
600.0000 mg | ORAL_TABLET | Freq: Four times a day (QID) | ORAL | 0 refills | Status: AC
  Filled 2024-09-25: qty 30, 8d supply, fill #0

## 2024-09-25 MED ORDER — FAMOTIDINE 40 MG PO TABS
40.0000 mg | ORAL_TABLET | Freq: Every day | ORAL | 1 refills | Status: DC
  Filled 2024-09-25: qty 30, 30d supply, fill #0

## 2024-09-25 NOTE — Discharge Summary (Signed)
 OB Discharge Summary  Patient Name: Elaine Montgomery DOB: September 06, 1996 MRN: [id number]  Date of admission: 09/20/2024 Delivering provider: HENRY SLOUGH  Admitting diagnosis: IUGR (intrauterine growth restriction) affecting care of mother, third trimester, fetus 1 [O36.5931] IUGR (intrauterine growth restriction) affecting care of mother, first trimester, fetus 1 [O36.5911] Intrauterine pregnancy: [redacted]w[redacted]d     Secondary diagnosis: Patient Active Problem List   Diagnosis Date Noted   Preeclampsia 09/24/2024   Status post primary low transverse cesarean section 09/24/2024   Postpartum care following cesarean delivery 11/2 09/24/2024   IUGR (intrauterine growth restriction) affecting care of mother, third trimester, fetus 1 09/20/2024    Date of discharge: 09/25/2024   Discharge diagnosis: Principal Problem:   Postpartum care following cesarean delivery 11/2 Active Problems:   IUGR (intrauterine growth restriction) affecting care of mother, third trimester, fetus 1   Preeclampsia   Status post primary low transverse cesarean section                                                          Augmentation: N/A Pain control: Spinal Laceration:None Complications: None  Hospital course:  Unscheduled C/S   28 y.o. yo G1P0 at [redacted]w[redacted]d was admitted to the hospital 09/20/2024 for PEC with severe features and FGR. She went for an urgent primary cesarean section with the following indication:Non-Reassuring FHR.Delivery details are as follows:  Membrane Rupture Time/Date: 1:25 PM,09/23/2024  Delivery Method:C-Section, Low Transverse Operative Delivery:N/A Details of operation can be found in separate operative note. Patient had a postpartum course complicated by PEC. She received magnesium sulfate for 24 hours postpartum. Her BPs remained stable without PO meds. She is ambulating, tolerating a regular diet, passing flatus, and urinating well. Patient is discharged home in stable condition on  09/25/24.  She will F/U in the office on Friday for a postpartum BP check.         Newborn Data: Birth date:09/23/2024 Birth time:1:26 PM Gender:Female Living status:Living Apgars:8 ,9  Weight:930 g    Physical exam  Vitals:   09/24/24 1958 09/25/24 0005 09/25/24 0449 09/25/24 0757  BP: 129/86 (!) 150/90 124/71 121/72  Pulse: (!) 52 (!) 50 (!) 45 (!) 53  Resp: 18 17 16 16   Temp: 97.9 F (36.6 C) 98.6 F (37 C) 98 F (36.7 C) 98 F (36.7 C)  TempSrc: Oral Oral Oral Oral  SpO2: 97% 98% 96% 99%  Weight:      Height:       General: alert and cooperative Lochia: appropriate Uterine Fundus: firm Incision: Healing well with no significant drainage, Dressing is clean, dry, and intact DVT Evaluation: No evidence of DVT seen on physical exam.  Labs: Lab Results  Component Value Date   WBC 17.5 (H) 09/24/2024   HGB 10.9 (L) 09/24/2024   HCT 31.6 (L) 09/24/2024   MCV 94.6 09/24/2024   PLT 258 09/24/2024      Latest Ref Rng & Units 09/24/2024    4:26 AM  CMP  Glucose 70 - 99 mg/dL 76   BUN 6 - 20 mg/dL 13   Creatinine 9.55 - 1.00 mg/dL 8.99   Sodium 864 - 854 mmol/L 131   Potassium 3.5 - 5.1 mmol/L 4.5   Chloride 98 - 111 mmol/L 102   CO2 22 - 32 mmol/L 19   Calcium 8.9 -  10.3 mg/dL 7.6   Total Protein 6.5 - 8.1 g/dL 5.9   Total Bilirubin 0.0 - 1.2 mg/dL 0.5   Alkaline Phos 38 - 126 U/L 109   AST 15 - 41 U/L 38   ALT 0 - 44 U/L 22       09/23/2024    7:00 PM  Edinburgh Postnatal Depression Scale Screening Tool  I have been able to laugh and see the funny side of things. 0  I have looked forward with enjoyment to things. 0  I have blamed myself unnecessarily when things went wrong. 0  I have been anxious or worried for no good reason. 0  I have felt scared or panicky for no good reason. 0  Things have been getting on top of me. 0  I have been so unhappy that I have had difficulty sleeping. 0  I have felt sad or miserable. 0  I have been so unhappy that I have been crying. 0   The thought of harming myself has occurred to me. 0  Edinburgh Postnatal Depression Scale Total 0   Discharge instructions:  per After Visit Summary  After Visit Meds:  Allergies as of 09/25/2024       Reactions   Cashew Nut Oil Anaphylaxis   Other Anaphylaxis   Pistachio Nut Extract Anaphylaxis   Cashew Nut (anacardium Occidentale) Skin Test Hives        Medication List     STOP taking these medications    metoCLOPramide  10 MG tablet Commonly known as: REGLAN    ondansetron  8 MG disintegrating tablet Commonly known as: ZOFRAN -ODT   polyethylene glycol powder 17 GM/SCOOP powder Commonly known as: MiraLax    promethazine  25 MG tablet Commonly known as: PHENERGAN    scopolamine  1 MG/3DAYS Commonly known as: TRANSDERM-SCOP       TAKE these medications    acetaminophen 500 MG tablet Commonly known as: TYLENOL Take 2 tablets (1,000 mg total) by mouth every 6 (six) hours.   famotidine  40 MG tablet Commonly known as: PEPCID  Take 1 tablet (40 mg total) by mouth daily. What changed:  medication strength how much to take when to take this   ibuprofen 600 MG tablet Commonly known as: ADVIL Take 1 tablet (600 mg total) by mouth every 6 (six) hours.   oxyCODONE 5 MG immediate release tablet Commonly known as: Oxy IR/ROXICODONE Take 1 tablet (5 mg total) by mouth every 4 (four) hours as needed for moderate pain (pain score 4-6).   Vitafol  Gummies 3.33-0.333-34.8 MG Chew Chew 1 tablet by mouth daily.       Activity: Advance as tolerated. Pelvic rest for 6 weeks.   Newborn Data: Live born female  Birth Weight: 2 lb 0.8 oz (930 g) APGAR: 8, 9  Newborn Delivery   Birth date/time: 09/23/2024 13:26:00 Delivery type: C-Section, Low Transverse Trial of labor: No C-section categorization: Primary    Named Paxton Baby Feeding: Pumping for baby in NICU Disposition:NICU  Delivery Report:  Review the Delivery Report for details.    Follow up:  Follow-up  Information     [facility]. Schedule an appointment as soon as possible for a visit on 09/28/2024.   Specialty: Obstetrics and Gynecology Why: For postpartum blood pressure check. Contact information: [address] [place]  [address] [phone]               Alan MARLA Joshua EDDY, MSN 09/25/2024, 10:37 AM

## 2024-09-25 NOTE — Progress Notes (Signed)
 Subjective: POD# 2 Live born female  Birth Weight: 2 lb 0.8 oz (930 g) APGAR: 8, 9  Newborn Delivery   Birth date/time: 09/23/2024 13:26:00 Delivery type: C-Section, Low Transverse Trial of labor: No C-section categorization: Primary    Baby name: Elaine Montgomery  Delivering provider: HENRY SLOUGH  Feeding: NICU  Pain control at delivery: Spinal  Reports feeling well. Desires discharge home later today.   Patient reports tolerating PO.   Pain controlled with acetaminophen, ibuprofen (OTC), and narcotic analgesics including Oxycodone Denies HA/SOB/C/P/N/V/dizziness. She reports vaginal bleeding as normal, without clots. She is ambulating and urinating without difficulty.     Objective: Vitals:   09/24/24 1958 09/25/24 0005 09/25/24 0449 09/25/24 0757  BP: 129/86 (!) 150/90 124/71 121/72  Pulse: (!) 52 (!) 50 (!) 45 (!) 53  Resp: 18 17 16 16   Temp: 97.9 F (36.6 C) 98.6 F (37 C) 98 F (36.7 C) 98 F (36.7 C)  TempSrc: Oral Oral Oral Oral  SpO2: 97% 98% 96% 99%  Weight:      Height:        Intake/Output Summary (Last 24 hours) at 09/25/2024 0801 Last data filed at 09/25/2024 0449 Gross per 24 hour  Intake 1206.23 ml  Output 1200 ml  Net 6.23 ml      Recent Labs    09/23/24 2040 09/24/24 0426  WBC 15.6* 17.5*  HGB 11.0* 10.9*  HCT 32.3* 31.6*  PLT 287 258   Blood type: --/--/B POS (11/02 1441) Rubella: 4.20 (10/30 1115)   Physical Exam:  General: alert and cooperative CV: Regular rate and rhythm Resp: clear Abdomen: soft, nontender, normal bowel sounds Incision: clean, dry, and intact Uterine Fundus: firm, below umbilicus, nontender Lochia: minimal Ext: extremities normal, atraumatic, no cyanosis or edema  Assessment/Plan: 28 y.o.   POD# 2. G1P0                  Principal Problem:   Postpartum care following cesarean delivery 11/2  Encourage to ambulate Routine post-op care Active Problems:   IUGR (intrauterine growth restriction) affecting care of  mother, third trimester, fetus 1   Preeclampsia  BPs stable, one mild range overnight, repeat x 2 today WNL  Labs stable  Plan F/U in the office Friday for BP check if discharge today   Status post primary low transverse cesarean section  Anticipate discharge home later today.   Elaine Montgomery, CNM, MSN 09/25/2024, 8:01 AM

## 2024-09-26 NOTE — Social Work (Signed)
 CLINICAL SOCIAL WORK MATERNAL/CHILD NOTE  Patient Details  Name: Elaine Montgomery MRN: [id number] Date of Birth: 09/23/2022  Date:  09/26/2024  Clinical Social Worker Initiating Note:  Nat Quiet, [place] Date/Time: Initiated:  09/25/24/1100     Child's Name:  [name] Crooked   Biological Parents:  Mother, Father (Mother: Olympia Adelsberger 03/01/1998, Father: Thamas Crooked 01/29/1994)   Need for Interpreter:  None   Reason for Referral:  Parental Support of Premature Babies < 32 weeks/or Critically Ill babies, Current Substance Use/Substance Use During Pregnancy     Address:  [address] [place] [address]    Phone number:  [phone] (home)     Additional phone number:   Household Members/Support Persons (HM/SP):   Household Member/Support Person 1, Household Member/Support Person 2, Household Member/Support Person 3, Household Member/Support Person 4, Household Member/Support Person 5, Household Member/Support Person 6   HM/SP Name Relationship DOB or Age  HM/SP -1 Miki Labuda Mother 23  HM/SP -2 Oretha Weismann Brother 23  HM/SP -3 Jace Popson Brother 22  HM/SP -4 Iyonna Marton Cousin 5  HM/SP -5 Alonyana Estas Sister in social worker    HM/SP -6 Tonu Falenalama Cousin 38  HM/SP -7        HM/SP -8          Natural Supports (not living in the home):  Spouse/significant other   Professional Supports: None   Employment: Full-time   Type of Work: Freight Forwarder   Education:  Engineer, maintenance (it)   Homebound arranged:    Surveyor, Quantity Resources:  Medicaid   Other Resources:  [facility]   Cultural/Religious Considerations Which May Impact Care:    Strengths:  Ability to meet basic needs  , Home prepared for child     Psychotropic Medications:         Pediatrician:       Pediatrician List:   [employer]    [facility]    [facility]      Pediatrician Fax Number:     Risk Factors/Current Problems:  Substance Use     Cognitive State:  Able to Concentrate  , Alert     Mood/Affect:  Calm  , Comfortable  , Interested     CSW Assessment:   CSW received consult for NICU admission. CSW met with MOB to offer support and complete assessment.    CSW met with MOB at bedside and introduced CSW role. MOB appeared calm, pleasant and engaged with during the visit. FOB was present at bedside. MOB confirmed that her demographic information on hospital file was correct and stated that she resides with her mother, brothers, brother's girlfriend, her cousin and her cousin's daughter. MOB shared that they are all excited about the baby. MOB reported that she is employed at [employer] and FOB is employed in audiological scientist estate. MOB reported that she applied for [employer] benefits and has an appointment to receive a breast pump on 11/6. MOB confirmed that she has all essential items to care for the infant including a car seat and crib.   CSW offered MOB privacy and FOB left the room to allow MOB privacy.   CSW asked MOB how she had been feeling since giving birth. MOB shared that she is still in shock and that she is still processing the face that she has given birth. MOB expressed that she is looking forward to holding her baby. CSW  acknowledged MOB's experience and validated her feelings. CSW asked MOB if she felt well-informed about her infant's care. She reported that she felt well-informed. MOB reported that she had been oriented to the NICU and expressed disappointment that her five-year-old cousin was unable to visit the baby. CSW empathetic support and reiterated NICU visitation policy. CSW acknowledged infant's gestational age/weight and discussed that MOB is eligible to apply for SSI benefits. CSW informed MOB that SSI benefits are not guaranteed and provided SSI information MOB to follow up. MOB verbalized understanding. CSW informed MOB about NICU support services and offered to check in  with MOB while the infant remains in the NICU.MOB was receptive.   CSW inquired if MOB had mental health history. MOB reported that she had never received a formal diagnosis but had experienced symptoms of generalized anxiety disorder in the past. MOB shared that she has previously engaged in therapy and is open to seeing a therapist again, if needed.  MOB denied current mental health concerns and did not present with any acute mental health symptoms during the visit.   CSW provided education regarding the baby blues period vs. perinatal mood disorders, discussed treatment and gave resources for mental health follow up if concerns arise.  CSW recommended MOB complete a self-evaluation during the postpartum time period using the New Mom Checklist from Postpartum Progress and encouraged MOB to contact a medical professional if symptoms are noted at any time.    CSW inquired about MOB's noted substance use during the pregnancy. MOB stated that she wanted to be transparent and reported using THC daily during the pregnancy. She reported that she had smoked recreationally prior to the pregnancy and had decreased use during the pregnancy. MOB reported the last time she smoked marijuana was the day before coming to the hospital on 10/29. She denied using any other substance during the pregnancy. CSW informed MOB about the hospital drug screen policy and informed her that CSW would continue to monitor the infant's CDS and make a report to [facility] CPS. MOB verbalized understanding.   CSW provided review of Sudden Infant Death Syndrome (SIDS) precautions. MOB verbalized understanding. CSW inquired if MOB had chosen a pediatrician for the infant. MOB reported that she and FOB were still deciding on pediatrician office. CSW offered a list of pediatricians for reference and agreed to leave a copy at bedside. MOB confirmed that she had transportation. MOB expressed appreciation. CSW assessed MOB for additional  needs. MOB reported none.   - CSW will continue to monitor the infant's CDS and make a report to CPS, if warranted.  CSW identifies no further need for intervention and no barriers to discharge at this time   CSW Plan/Description:  SSI information provided, Sudden Infant Death Syndrome (SIDS) Education, No Further Intervention Required/No Barriers to Discharge, CSW Will Continue to Monitor Umbilical Cord Tissue Drug Screen Results and Make Report if [facility], Hospital Drug Screen Policy Information, Perinatal Mood and Anxiety Disorder (PMADs) Education    Nat DELENA Quiet, LCSW 09/26/2024, 8:30 AM

## 2024-09-28 NOTE — Lactation Note (Signed)
 This note was copied from a baby's chart.  NICU Lactation Consultation Note  Patient Name: Elaine Montgomery Unijb'd Date: 09/28/2024 Age:26 days  Reason for consult: Follow-up assessment; Primapara; 1st time breastfeeding; Other (Comment); NICU baby; Preterm <34wks; Infant < 5lbs (IUGR, cord THC (+))  SUBJECTIVE Visited with family of 26 69/10 weeks old AGA NICU female [name]; Ms. Cobos is a P1 and reported she's pumping, her milk came in, praised her for all her efforts. Noticed that pumping hasn't been consistent. Explained the importance of consistent pumping for the prevention of engorgement and to protect her supply. She voiced her plan is to provide baby with breastmilk only if possible. She was still using initiation mode on the Symphony pump, reviewed pump settings, pumping schedule, benefits of STS care and anticipatory guidelines. Left couplet engaging in STS care.   OBJECTIVE Infant data: Mother's Current Feeding Choice: -- (NPO)  O2 Device: CPAP FiO2 (%): 21 %  Maternal data: G1P0 C-Section, Low Transverse Pumping frequency: 3-4 times/24 hours Pumped volume: 20 mL (20-30 ml)  WIC Program: Yes WIC Referral Sent?: Yes What county?: [place] Pump: WIC Pump, Hands Free, Personal (MomCozy wearable through insurance)  ASSESSMENT Infant: Feeding Status: NPO Feeding method: Tube/Gavage (Bolus)  Maternal: Milk volume: Normal  INTERVENTIONS/PLAN Interventions: Interventions: Breast feeding basics reviewed; DEBP; Education; Skin to skin Discharge Education: Engorgement and breast care  Plan: STS around care times; pump right after Pump both breasts on maintain mode for 15-30 minutes, ideally 8 pumping sessions/24 hours  No other support person at this time. All questions and concerns answered, family to contact [facility] services PRN.  Consult Status: NICU follow-up NICU Follow-up type: Weekly NICU follow up   Liticia Gasior S Belma Dyches 09/28/2024, 11:11 AM

## 2024-10-02 ENCOUNTER — Other Ambulatory Visit: Payer: Self-pay

## 2024-10-02 DIAGNOSIS — T8149XA Infection following a procedure, other surgical site, initial encounter: Secondary | ICD-10-CM | POA: Diagnosis not present

## 2024-10-03 ENCOUNTER — Inpatient Hospital Stay (HOSPITAL_COMMUNITY)
Admission: AD | Admit: 2024-10-03 | Discharge: 2024-10-03 | Disposition: A | Discharge status: Home / Self Care | Attending: Obstetrics and Gynecology | Admitting: Obstetrics and Gynecology

## 2024-10-03 ENCOUNTER — Other Ambulatory Visit: Payer: Self-pay

## 2024-10-03 ENCOUNTER — Encounter (HOSPITAL_COMMUNITY): Payer: Self-pay | Admitting: *Deleted

## 2024-10-03 DIAGNOSIS — R030 Elevated blood-pressure reading, without diagnosis of hypertension: Secondary | ICD-10-CM | POA: Diagnosis present

## 2024-10-03 DIAGNOSIS — O1093 Unspecified pre-existing hypertension complicating the puerperium: Secondary | ICD-10-CM | POA: Diagnosis not present

## 2024-10-03 DIAGNOSIS — I10 Essential (primary) hypertension: Secondary | ICD-10-CM

## 2024-10-03 DIAGNOSIS — Z79899 Other long term (current) drug therapy: Secondary | ICD-10-CM | POA: Insufficient documentation

## 2024-10-03 LAB — CBC WITH DIFFERENTIAL/PLATELET
Abs Immature Granulocytes: 0.02 K/uL (ref 0.00–0.07)
Basophils Absolute: 0.1 K/uL (ref 0.0–0.1)
Basophils Relative: 1 %
Eosinophils Absolute: 0.2 K/uL (ref 0.0–0.5)
Eosinophils Relative: 2 %
HCT: 30.1 % — ABNORMAL LOW (ref 36.0–46.0)
Hemoglobin: 10.1 g/dL — ABNORMAL LOW (ref 12.0–15.0)
Immature Granulocytes: 0 %
Lymphocytes Relative: 26 %
Lymphs Abs: 2.2 K/uL (ref 0.7–4.0)
MCH: 32.3 pg (ref 26.0–34.0)
MCHC: 33.6 g/dL (ref 30.0–36.0)
MCV: 96.2 fL (ref 80.0–100.0)
Monocytes Absolute: 0.6 K/uL (ref 0.1–1.0)
Monocytes Relative: 7 %
Neutro Abs: 5.6 K/uL (ref 1.7–7.7)
Neutrophils Relative %: 64 %
Platelets: 379 K/uL (ref 150–400)
RBC: 3.13 MIL/uL — ABNORMAL LOW (ref 3.87–5.11)
RDW: 15.1 % (ref 11.5–15.5)
WBC: 8.7 K/uL (ref 4.0–10.5)
nRBC: 0 % (ref 0.0–0.2)

## 2024-10-03 LAB — COMPREHENSIVE METABOLIC PANEL WITH GFR
ALT: 26 U/L (ref 0–44)
AST: 35 U/L (ref 15–41)
Albumin: 3.4 g/dL — ABNORMAL LOW (ref 3.5–5.0)
Alkaline Phosphatase: 83 U/L (ref 38–126)
Anion gap: 12 (ref 5–15)
BUN: 10 mg/dL (ref 6–20)
CO2: 22 mmol/L (ref 22–32)
Calcium: 8.8 mg/dL — ABNORMAL LOW (ref 8.9–10.3)
Chloride: 103 mmol/L (ref 98–111)
Creatinine, Ser: 0.79 mg/dL (ref 0.44–1.00)
GFR, Estimated: >60 mL/min (ref >60)
Glucose, Bld: 86 mg/dL (ref 70–99)
Potassium: 3.7 mmol/L (ref 3.5–5.1)
Sodium: 137 mmol/L (ref 135–145)
Total Bilirubin: 0.8 mg/dL (ref 0.0–1.2)
Total Protein: 6.8 g/dL (ref 6.5–8.1)

## 2024-10-03 MED ORDER — FUROSEMIDE 20 MG PO TABS: 20.0000 mg | ORAL_TABLET | Freq: Every day | ORAL | 0 refills | Status: DC

## 2024-10-03 MED ORDER — ACETAMINOPHEN 500 MG PO TABS
1000.0000 mg | ORAL_TABLET | Freq: Once | ORAL | Status: AC
  Administered 2024-10-03: 1000 mg via ORAL
  Filled 2024-10-03: qty 2

## 2024-10-03 MED ORDER — POTASSIUM CHLORIDE CRYS ER 10 MEQ PO TBCR: 20.0000 meq | EXTENDED_RELEASE_TABLET | Freq: Every day | ORAL | 0 refills | Status: DC

## 2024-10-03 MED ORDER — NIFEDIPINE ER OSMOTIC RELEASE 30 MG PO TB24: 30.0000 mg | ORAL_TABLET | Freq: Every day | ORAL | 1 refills | Status: DC

## 2024-10-03 MED ORDER — NIFEDIPINE ER OSMOTIC RELEASE 30 MG PO TB24
30.0000 mg | ORAL_TABLET | Freq: Once | ORAL | Status: AC
  Administered 2024-10-03: 30 mg via ORAL
  Filled 2024-10-03: qty 1

## 2024-10-03 MED ORDER — CAFFEINE 200 MG PO TABS
200.0000 mg | ORAL_TABLET | Freq: Once | ORAL | Status: AC
  Administered 2024-10-03: 200 mg via ORAL
  Filled 2024-10-03: qty 1

## 2024-10-03 NOTE — MAU Note (Signed)
 Elaine Montgomery is a 28 y.o. at Unknown here in MAU reporting: she had BP taken by home nurse and BP's were 162/100 and 158/96.  States has current HA, took Ibuprofen at 1230 and no relief noted.  Denies visual disturbances  and epigastric pain.  States prior to arrival to MAU had flutters in chest and chest pain which is now resolved. PP C/S 09/23/2024.  LMP: NA Onset of complaint: today Pain score: 6 Vitals:   10/03/24 1418  BP: (!) 142/84  Pulse: 66  Resp: 18  Temp: 98.4 F (36.9 C)  SpO2: 99%     FHT: NA  Lab orders placed from triage: None

## 2024-10-03 NOTE — MAU Provider Note (Addendum)
 Chief Complaint:  BP Evaluation   HPI   None     Elaine Montgomery is a 28 y.o. G1P0 at 10 days PP who presents to maternity admissions reporting she had her BP taken by home health nurse today with elevated readings of 162/100 and 158/96. She had a headache, took ibuprofen at 1230 without relief. She denies visual disturbances or epigastric pain or edema. She reports her bleeding is appropriate and light .   Pregnancy Course: [facility]  Past Medical History:  Diagnosis Date   Headache    OB History  Gravida Para Term Preterm AB Living  1     1  SAB IAB Ectopic Multiple Live Births          # Outcome Date GA Lbr Len/2nd Weight Sex Type Anes PTL Lv  1 Gravida            Past Surgical History:  Procedure Laterality Date   CESAREAN SECTION N/A 09/23/2024   Procedure: CESAREAN DELIVERY;  Surgeon: Henry Slough, MD;  Location: [facility] LD [facility]S;  Service: Obstetrics;  Laterality: N/A;   WISDOM TOOTH EXTRACTION     Family History  Problem Relation Age of Onset   Healthy Mother    Kidney failure Father    Breast cancer Paternal Aunt    High blood pressure Maternal Grandmother        Died at 40   Alzheimer's disease Maternal Grandfather        Died at 26   Cancer Paternal Grandfather        Age at time of death unknown   Colon cancer Neg Hx    Esophageal cancer Neg Hx    Stomach cancer Neg Hx    Social History   Tobacco Use   Smoking status: Never   Smokeless tobacco: Never  Vaping Use   Vaping status: Never Used  Substance Use Topics   Alcohol use: Yes   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    I have reviewed patient's Past Medical Hx, Surgical Hx, Family Hx, Social Hx, medications and allergies.   ROS  Pertinent items noted in HPI and  remainder of comprehensive ROS otherwise negative.   PHYSICAL EXAM  Patient Vitals for the past 24 hrs:  BP Temp Temp src Pulse Resp SpO2 Height Weight  10/03/24 1715 (!) 142/93 -- -- 68 -- -- -- --  10/03/24 1700 137/88 -- -- 65 -- -- -- --  10/03/24 1645 133/80 -- -- 65 -- -- -- --  10/03/24 1630 129/84 -- -- 72 -- -- -- --  10/03/24 1615 122/74 -- -- 67 -- -- -- --  10/03/24 1600 131/76 -- -- (!) 53 -- -- -- --  10/03/24 1545 (!) 139/93 -- -- 67 -- -- -- --  10/03/24 1530 (!) 160/103 -- -- (!) 58 -- -- -- --  10/03/24 1515 (!) 147/79 -- -- (!) 50 -- -- -- --  10/03/24 1500 (!) 144/98 -- -- (!) 59 -- -- -- --  10/03/24 1445 138/77 -- -- (!) 52 -- -- -- --  10/03/24 1429 139/82 -- -- (!) 55 -- -- -- --  10/03/24 1418 (!) 142/84 98.4 F (36.9 C) Oral 66 18 99 % -- --  10/03/24 1411 -- -- -- -- -- -- 5' 4.75 (1.645 m) 51.8 kg    Physical Exam Vitals and nursing note reviewed.  Constitutional:      General: She is not in acute distress.    Appearance: Normal appearance. She is not ill-appearing or diaphoretic.  HENT:     Head: Normocephalic.  Cardiovascular:     Rate and Rhythm: Normal rate.     Pulses: Normal pulses.  Pulmonary:     Effort: Pulmonary effort is normal.  Skin:    General: Skin is warm and dry.     Capillary Refill: Capillary refill takes less than 2 seconds.  Neurological:     General: No focal deficit present.     Mental Status: She is alert and oriented to person, place, and time.     Motor: Motor function is intact.     Coordination: Coordination is intact.     Gait: Gait is intact.     Deep Tendon Reflexes:     Reflex Scores:      Patellar reflexes are 2+ on the right side and 2+ on the left side.    Comments: Negative clonus   Psychiatric:        Mood and Affect: Mood normal.        Behavior: Behavior normal.        Thought Content: Thought content normal.        Judgment: Judgment normal.         Labs: Results for orders placed or  performed during the hospital encounter of 10/03/24 (from the past 24 hours)  CBC with Differential/Platelet     Status: Abnormal   Collection Time: 10/03/24  2:33 PM  Result Value Ref Range   WBC 8.7 4.0 - 10.5 K/uL   RBC 3.13 (L) 3.87 - 5.11 MIL/uL   Hemoglobin 10.1 (L) 12.0 - 15.0 g/dL   HCT 69.8 (L) 63.9 - 53.9 %   MCV 96.2 80.0 - 100.0 fL   MCH 32.3 26.0 - 34.0 pg   MCHC 33.6 30.0 - 36.0 g/dL   RDW 84.8 88.4 - 84.4 %   Platelets 379 150 - 400 K/uL   nRBC 0.0 0.0 - 0.2 %   Neutrophils Relative % 64 %   Neutro Abs 5.6 1.7 - 7.7 K/uL   Lymphocytes Relative 26 %   Lymphs Abs 2.2 0.7 - 4.0 K/uL   Monocytes Relative 7 %   Monocytes Absolute 0.6 0.1 - 1.0 K/uL   Eosinophils Relative 2 %   Eosinophils Absolute 0.2 0.0 - 0.5 K/uL   Basophils Relative 1 %   Basophils Absolute 0.1 0.0 - 0.1 K/uL   Immature Granulocytes 0 %   Abs Immature Granulocytes 0.02 0.00 - 0.07 K/uL  Comprehensive metabolic panel     Status: Abnormal   Collection Time: 10/03/24  2:33 PM  Result Value Ref Range   Sodium 137 135 - 145 mmol/L   Potassium 3.7 3.5 - 5.1 mmol/L   Chloride 103 98 - 111 mmol/L   CO2 22 22 - 32 mmol/L   Glucose, Bld 86 70 - 99 mg/dL   BUN 10 6 - 20 mg/dL   Creatinine, Ser 9.20 0.44 - 1.00 mg/dL   Calcium 8.8 (L) 8.9 - 10.3 mg/dL   Total Protein 6.8 6.5 - 8.1 g/dL   Albumin 3.4 (L) 3.5 - 5.0 g/dL   AST 35 15 - 41 U/L   ALT 26 0 - 44 U/L   Alkaline  Phosphatase 83 38 - 126 U/L   Total Bilirubin 0.8 0.0 - 1.2 mg/dL   GFR, Estimated >39 >39 mL/min   Anion gap 12 5 - 15    Imaging:  No results found.  MDM & MAU COURSE  MDM: Moderate Evaluate for PP PEC: CBC and CMP benign.  Improvement of HA with caffeine, suspect 2/2 infant in NICU and insomnia resulting from that.  Consulted Dr. Izell who recommended: Procardia 30xL PO daily, lasix 20mg  for 5 days and potassium 20mg  PO for 5 days, additionally recommend a PP visit at [facility] for BP check 10/05/24. Relayed recommendations to  Dr Armond with [facility], who agrees with recommendations.   MAU Course: Orders Placed This Encounter  Procedures   CBC with Differential/Platelet   Comprehensive metabolic panel   Discharge patient   Meds ordered this encounter  Medications   acetaminophen (TYLENOL) tablet 1,000 mg   caffeine tablet 200 mg   NIFEdipine (PROCARDIA-XL/NIFEDICAL-XL) 24 hr tablet 30 mg   NIFEdipine (PROCARDIA XL) 30 MG 24 hr tablet    Sig: Take 1 tablet (30 mg total) by mouth daily.    Dispense:  30 tablet    Refill:  1    Supervising Provider:   PICKENS, CHARLIE [[id number]]   furosemide (LASIX) 20 MG tablet    Sig: Take 1 tablet (20 mg total) by mouth daily.    Dispense:  5 tablet    Refill:  0    Supervising Provider:   PICKENS, CHARLIE [[id number]]   potassium chloride (KLOR-CON M) 10 MEQ tablet    Sig: Take 2 tablets (20 mEq total) by mouth daily.    Dispense:  5 tablet    Refill:  0    Supervising Provider:   PICKENS, CHARLIE [[id number]]    ASSESSMENT   1. Chronic hypertension   2. Postpartum care and examination     PLAN  Discharge home in stable condition.  Procardia 30mg  XL daily PO for blood pressure. Lasix 20mg  daily for 5 days PO, for fluid retention. Potassium 20mg  daily for 5 days, to prevent hypokalemia 2/2 lasix administration.  Encouraged nutritious diet, including continuation of PNV and protein for calcium replacement and improved protein for tissue repair. Continue iron supplement PRN. Follow up at [facility]:   Follow-up Information     [facility] Follow up on 10/05/2024.   Specialty: Obstetrics and Gynecology Contact information: 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   famotidine  40 MG tablet Commonly known as: PEPCID  Take 1 tablet (40 mg total) by mouth daily.   furosemide 20 MG tablet Commonly known as: LASIX Take 1 tablet (20 mg total) by mouth daily.   ibuprofen 600 MG tablet Commonly known as: ADVIL Take 1 tablet (600 mg total) by mouth every 6 (six) hours.   NIFEdipine 30 MG 24 hr tablet Commonly known as: Procardia XL Take 1 tablet (30 mg total) by mouth daily.   oxyCODONE 5 MG immediate  release tablet Commonly known as: Oxy IR/ROXICODONE Take 1 tablet (5 mg total) by mouth every 4 (four) hours as needed for moderate pain (pain score 4-6).   potassium chloride 10 MEQ tablet Commonly known as: KLOR-CON M Take 2 tablets (20 mEq total) by mouth daily.   Vitafol  Gummies 3.33-0.333-34.8 MG Chew Chew 1 tablet by mouth daily.        Camie Rote, MSN, CNM 10/03/2024 5:52 PM  Certified Nurse Midwife, [facility]

## 2024-10-09 ENCOUNTER — Telehealth (HOSPITAL_COMMUNITY): Payer: Self-pay | Admitting: *Deleted

## 2024-10-09 NOTE — Telephone Encounter (Signed)
 Attempted hospital discharge follow-up call. Left message for patient to return RN call with any questions or concerns. Allean IVAR Carton, RN, 10/09/24, [phone]

## 2024-10-10 ENCOUNTER — Telehealth (HOSPITAL_COMMUNITY): Payer: Self-pay | Admitting: *Deleted

## 2024-10-10 NOTE — Telephone Encounter (Signed)
 10/10/2024  Name: Elaine Montgomery MRN: [id number] DOB: Aug 19, 1996  Reason for Call:  Transition of Care Hospital Discharge Call  Contact Status: Patient Contact Status: Complete  Language assistant needed:          Follow-Up Questions: Do You Have Any Concerns About Your Health As You Heal From Delivery?: Yes What Concerns Do You Have About Your Health?: Patient asked about incision soreness. RN reviewed symptoms to report to MD. None reported by patient. Patient also reported that her BP was elevated yesterday morning. 130/110 per her report. Stated that she started all of the medication yesterday. BP yesterday evening was 130/77, per her report. RN instructed patient to take medications as prescribed. Encouraged patient to take her BP this evening and tomorrow AM. If values are >140/90, or patient is symptomatic, instructed patient to call OB and to come to MAU for evaluation. Patient verbalized understanding. No other questions or concerns voiced at this time. Do You Have Any Concerns About Your Infants Health?: Infant in NICU  Edinburgh Postnatal Depression Scale:  In the Past 7 Days: I have been able to laugh and see the funny side of things.: As much as I always could I have looked forward with enjoyment to things.: As much as I ever did I have blamed myself unnecessarily when things went wrong.: Yes, some of the time I have been anxious or worried for no good reason.: No, not at all I have felt scared or panicky for no good reason.: No, not at all Things have been getting on top of me.: No, most of the time I have coped quite well I have been so unhappy that I have had difficulty sleeping.: Not at all I have felt sad or miserable.: No, not at all I have been so unhappy that I have been crying.: Only occasionally The thought of harming myself has occurred to me.: Never [place] Postnatal Depression Scale Total: 4  PHQ2-9 Depression Scale:     Discharge  Follow-up: Edinburgh score requires follow up?: No Patient was advised of the following resources:: Breastfeeding Support Group, Support Group Requested email information - sent by RN. Post-discharge interventions: Reviewed Newborn Safe Sleep Practices  Signature Allean IVAR Carton, RN, 10/10/24, [phone]

## 2024-10-21 NOTE — Lactation Note (Signed)
 This note was copied from a baby's chart.  NICU Lactation Consultation Note  Patient Name: Elaine Montgomery Date: 10/21/2024 Age:26 wk.o.  Reason for consult: NICU baby; Late-preterm 34-36.6wks; Primapara; 1st time breastfeeding; Infant < 5lbs; Other (Comment); Weekly NICU follow-up (IUGR, cord THC (+))  SUBJECTIVE Visited with family of 47 47/31 weeks old AGA NICU female Elaine Montgomery; Elaine Montgomery is a P1 and reported she's been pumping, baby got MOM on the last two feedings, praised her for all her efforts. Noticed that pumping hasn't been consistent and supply has dwindled. Re-educated about the importance of consistent and frequent pumping for the prevention of engorgement and to protect her supply. She voiced sometimes she gets knots near the axilla area, she normally pumps for 15 minutes at a time. She pumps at home but is not pumping at the bedside. Revised strategies to increase supply such as STS care, power pumping and using a hospital grade pump (at the bedside and at home).  OBJECTIVE Infant data: Mother's Current Feeding Choice: Breast Milk and Donor Milk  O2 Device: HHFNC O2 Flow Rate (L/min): 3 L/min FiO2 (%): 21 %  Infant feeding assessment IDFTS - Readiness: 5 (HHFNC 3L)   Maternal data: G1P0 C-Section, Low Transverse Pumping frequency: 4 times/24 hours Pumped volume: 10 mL (10-20 ml)  WIC Program: Yes WIC Referral Sent?: Yes What county?: [place] Pump: WIC Pump, Hands Free, Personal (MomCozy wearable through insurance)  ASSESSMENT Infant: Feeding Status: Scheduled 9-12-3-6 Feeding method: Tube/Gavage (Bolus)  Maternal: Milk volume: Low  INTERVENTIONS/PLAN Interventions: Interventions: Breast feeding basics reviewed; Coconut oil; DEBP; Education  Plan: STS around care times; pump right after Pump both breasts on maintain mode for 30 minutes, ideally 8 pumping sessions/24 hours Power pump twice daily Ice her breasts if knots persist after 30 minutes  of pumping   MGM present and supportive. All questions and concerns answered, family to contact [facility] services PRN.  Consult Status: NICU follow-up NICU Follow-up type: Weekly NICU follow up   Amdrew Oboyle S Miriam 10/21/2024, 7:21 PM

## 2024-10-24 DIAGNOSIS — N9089 Other specified noninflammatory disorders of vulva and perineum: Secondary | ICD-10-CM | POA: Diagnosis not present

## 2024-10-24 DIAGNOSIS — Z308 Encounter for other contraceptive management: Secondary | ICD-10-CM | POA: Diagnosis not present

## 2024-10-24 DIAGNOSIS — Z1331 Encounter for screening for depression: Secondary | ICD-10-CM | POA: Diagnosis not present

## 2024-10-24 DIAGNOSIS — F418 Other specified anxiety disorders: Secondary | ICD-10-CM | POA: Diagnosis not present

## 2024-10-25 ENCOUNTER — Other Ambulatory Visit: Payer: Self-pay | Admitting: Certified Nurse Midwife

## 2024-11-08 NOTE — Lactation Note (Signed)
 This note was copied from a baby's chart.  NICU Lactation Consultation Note  Patient Name: Elaine Montgomery Unijb'd Date: 11/08/2024 Age:26 wk.o.  Reason for consult: Weekly NICU follow-up; Other (Comment); Early term 69-38.6wks; 1st time breastfeeding; Primapara; Infant < 5lbs (IUGR, cord THC (+), telephone call)  SUBJECTIVE Visited with family of 26 34/64 weeks old AGA NICU female [name]; Ms. Benda is a P1 and reported she's been pumping but not as often as she should; she hasn't been feeling well lately. Noticed that her supply continues to dwindle. Discussed some strategies to increase supply such as STS care, power pumping and using a hospital grade pump. Encouraged to give her body at least 5-7 days to see the results of these interventions and to aim for 128 oz of fluid intake daily.  OBJECTIVE Infant data: Mother's Current Feeding Choice: Breast Milk and Formula  O2 Device: HHFNC O2 Flow Rate (L/min): 4 L/min FiO2 (%): 21 %  Infant feeding assessment IDFS - Readiness: 5 (HHFNC 4L)   Maternal data: G1P0 C-Section, Low Transverse Pumping frequency: 3 times/24 hours Pumped volume: 8 mL (8-20 ml)  WIC Program: Yes WIC Referral Sent?: Yes What county?: [place] Pump: WIC Pump, Hands Free, Personal (MomCozy wearable through insurance)  ASSESSMENT Infant: Feeding Status: Scheduled 9-12-3-6 Feeding method: Tube/Gavage (Bolus)  Maternal: Milk volume: Low  INTERVENTIONS/PLAN Interventions: Interventions: Breast feeding basics reviewed; Coconut oil; DEBP; Education  Plan: STS around care times; pump right after Pump both breasts on maintain mode for 30 minutes, ideally 8 pumping sessions/24 hours Power pump twice daily   FOB and female visitor present and supportive. All questions and concerns answered, family to contact [facility] services PRN.  Consult Status: NICU follow-up NICU Follow-up type: Weekly NICU follow up   Mir Fullilove S Miriam 11/08/2024, 5:29 PM

## 2024-11-11 NOTE — Lactation Note (Signed)
 This note was copied from a baby's chart.  NICU Lactation Consultation Note  Patient Name: Elaine Montgomery Unijb'd Date: 11/11/2024 Age:28 yr.o.  Reason for consult: Weekly NICU follow-up; NICU baby; Early term 8-38.6wks; Primapara; 1st time breastfeeding; Infant < 5lbs; Other (Comment); Mother's request; RN request; Breastfeeding assistance (IUGR, cord THC (+))  SUBJECTIVE NICU RN Brandy asked this LC to assist with the 6 pm feeding. Ms. Richer has been taking baby [name] to breast for some lick and learn before, but he hasn't been quite latching yet. She had baby on football hold on the L pumped breast; he was wide awake, alert but would not open his mouth when offered the breast. Assisted with breast massage and hand expression to get droplets on nipple with the same results.   Tried a NS # 16 to see if we can get a wider angle, he briefly opened his mouth but then collapsed the NS # 16 and started crying, tried another time with the same results, he was not interested in PO feeding at this time. However, he likes his paci and will suck on it while the gavage feeding was running. Placed baby prone and left couplet engaging in STS care. Asked her to call for assistance when needed. Revised pumping schedule, she voiced she tried power pumping once but needs to stay consistent.   OBJECTIVE Infant data: Mother's Current Feeding Choice: Breast Milk and Formula  O2 Device: Room Air O2 Flow Rate (L/min): 2 L/min FiO2 (%): 21 %  Infant feeding assessment IDFS - Readiness: 2   Maternal data: G1P0 C-Section, Low Transverse No data recorded WIC Program: Yes WIC Referral Sent?: Yes What county?: [place] Pump: WIC Pump, Hands Free, Personal (MomCozy wearable through insurance)  ASSESSMENT Infant: Latch: Too sleepy or reluctant, no latch achieved, no sucking elicited. Audible Swallowing: None Type of Nipple: Everted at rest and after stimulation Comfort (Breast/Nipple): Soft /  non-tender Hold (Positioning): Assistance needed to correctly position infant at breast and maintain latch. LATCH Score: 5  Feeding Status: Scheduled 9-12-3-6 Feeding method: Tube/Gavage (Bolus); Breast (attempt)  Maternal: No data recorded INTERVENTIONS/PLAN Interventions: Interventions: Breast feeding basics reviewed; Assisted with latch; Breast massage; Hand express; Skin to skin; Breast compression; Adjust position; Support pillows; Coconut oil; DEBP; Education Tools: Nipple Shields Nipple shield size: 16  Plan: STS around care times; pump right after Pump both breasts on maintain mode for 30 minutes, ideally 8 pumping sessions/24 hours Power pump twice daily Offer a pumped breast on feeding cues around feeding times using NS # 16 PRN   MGM present and supportive. All questions and concerns answered, family to contact [facility] services PRN.  Consult Status: NICU follow-up NICU Follow-up type: Weekly NICU follow up; Assist with IDF-1 (Mother to pre-pump before breastfeeding)   [name] 11/11/2024, 6:58 PM

## 2024-11-16 NOTE — Lactation Note (Signed)
 This note was copied from a baby's chart.  NICU Lactation Consultation Note  Patient Name: Elaine Montgomery Unijb'd Date: 11/16/2024 Age:26 wk.o.  Reason for consult: Weekly NICU follow-up; NICU baby; Early term 3-38.6wks; Primapara; 1st time breastfeeding; Infant < 5lbs; Other (Comment); Mother's request; RN request; Breastfeeding assistance (IUGR, cord THC (+))   SUBJECTIVE NICU RN Brandy asked LC to assist with 12PM feeding. LC and LC student assisted mom with feeding on the left breast in cross cradle position. Mom pumped breast prior to latching baby. Elaine Montgomery open wide and latched to breast when it was offered. LC and LC student assisted with breast massage. Baby engaged in nutritive and non nutritive sucking patterns for 9 minutes prior to falling asleep at the breast. Left couplet engaging in STS care prior to exiting the room  Mom stated that she had pumped 1x time in the last 24 hours and that she has only produced 7mL. She was encouraged to stick to pumping schedule to increase her supply. Mom stated that she was using 21 flange, did not have size 18 that was previously given. LC gave mom pair of size 18 flanges to use. LC shared information on flange inserts for mom to utilize for personal pump if needed. Mom was told to call for assistance if needed.  OBJECTIVE Infant data: Mother's Current Feeding Choice: Breast Milk and Formula  O2 Device: Room Air  Infant feeding assessment IDFS - Readiness: 2 IDFS - Quality: 5 (infant tachypnic and tongue thrusting no-flow nipple)   Maternal data: G1P0 C-Section, Low Transverse Pumping frequency: 1 time per 24 hours Pumped volume: 7 mL (25mL without pumping all day) Flange Size: 18 (Mom was given another set of 18 Flanges)  WIC Program: Yes WIC Referral Sent?: Yes What county?: [place] Pump: WIC Pump, Hands Free, Personal (MomCozy wearable through insurance)  ASSESSMENT Infant: Latch: Grasps breast easily, tongue down,  lips flanged, rhythmical sucking. Audible Swallowing: None Type of Nipple: Everted at rest and after stimulation Comfort (Breast/Nipple): Soft / non-tender Hold (Positioning): Assistance needed to correctly position infant at breast and maintain latch. LATCH Score: 7  Feeding Status: Scheduled 9-12-3-6 (Simultaneous filing. User may not have seen previous data.) Feeding method: Tube/Gavage (Bolus); Breast  Maternal: Milk volume: Low  INTERVENTIONS/PLAN Interventions: Interventions: Breast feeding basics reviewed; Assisted with latch; Skin to skin; Breast massage; Hand express; Breast compression; Adjust position; Support pillows; DEBP; Education; Infant Driven Feeding Algorithm education Tools: Flanges  Plan: STS around care times; pump right after Pump both breasts on maintain mode for 30 minutes, ideally 8 pumping sessions/24 hours Offer a pumped breast on feeding cues around feeding times  No other support person present at this time. All questions and concerns answered, family to contact [facility] services PRN.   Consult Status: NICU follow-up NICU Follow-up type: Weekly NICU follow up   [name] 11/16/2024, 12:52 PM

## 2024-11-26 NOTE — Lactation Note (Signed)
 This note was copied from a baby's chart.  NICU Lactation Consultation Note  Patient Name: Elaine Montgomery Date: 11/26/2024 Age:27 m.o.  Reason for consult: Weekly NICU follow-up; 1st time breastfeeding; Primapara; NICU baby; Term; Infant < 6lbs; Other (Comment) (Cord THC (+))  SUBJECTIVE Visited with family of 6 76/75 weeks old AGA NICU female Elaine Montgomery; Elaine Montgomery is a P1 and reported she's still pumping but hasn't been consistent due to the holidays. She got a call from the [facility] office and she was told she could use Reglan  (she used it for GI tract issues during the pregnancy) to increase supply. Explained that this Rx will only work with enough nipple stimulation and consistent pumping, she voiced understanding. Re-educated on strategies to increase supply.   OBJECTIVE Infant data: Mother's Current Feeding Choice: Breast Milk and Formula  O2 Device: Room Air  Infant feeding assessment IDFS - Readiness: 2 IDFS - Quality: 5 (feeding stopped related to tachypnea rr 90's no desaturations or chocking episodes)   Maternal data: G1P0 C-Section, Low Transverse Pumping frequency: 1-6 times/24 hours due to the holidays Pumped volume: 3 mL (3-7 ml)  WIC Program: Yes WIC Referral Sent?: Yes What county?: [place] Pump: WIC Pump, Hands Free, Personal (MomCozy wearable through insurance)  ASSESSMENT Infant: Feeding Status: Scheduled 9-12-3-6 Feeding method: Bottle; Tube/Gavage (Bolus) Nipple Type: Dr. Jonna Fling Preemie  Maternal: Milk volume: Low  INTERVENTIONS/PLAN Interventions: Interventions: Breast feeding basics reviewed; Coconut oil; DEBP; Education  Plan: STS around care times; pump right after Pump both breasts on maintain mode for 30 minutes, ideally 8 pumping sessions/24 hours Power pump twice daily Offer the breast on feeding cues around feeding times using NS # 16 PRN for comfort if desired   No other support person at this time. All questions and concerns  answered, family to contact [facility] services PRN.  Consult Status: NICU follow-up NICU Follow-up type: Weekly NICU follow up   Elaine Montgomery Crate 11/26/2024, 12:34 PM

## 2024-12-03 ENCOUNTER — Other Ambulatory Visit (HOSPITAL_COMMUNITY): Payer: Self-pay

## 2024-12-07 ENCOUNTER — Encounter: Payer: Self-pay | Admitting: Family Medicine

## 2024-12-07 ENCOUNTER — Ambulatory Visit (INDEPENDENT_AMBULATORY_CARE_PROVIDER_SITE_OTHER): Admitting: Family Medicine

## 2024-12-07 VITALS — BP 120/68 | HR 62 | Temp 98.1°F | Ht 64.75 in | Wt 113.0 lb

## 2024-12-07 DIAGNOSIS — Z0000 Encounter for general adult medical examination without abnormal findings: Secondary | ICD-10-CM | POA: Diagnosis not present

## 2024-12-07 DIAGNOSIS — D229 Melanocytic nevi, unspecified: Secondary | ICD-10-CM | POA: Diagnosis not present

## 2024-12-07 DIAGNOSIS — Q825 Congenital non-neoplastic nevus: Secondary | ICD-10-CM | POA: Diagnosis not present

## 2024-12-07 NOTE — Progress Notes (Signed)
 "  Established Patient Office Visit   Subjective  Patient ID: Elaine Montgomery, female    DOB: 09-Oct-1996  Age: 29 y.o. MRN: [id number]  Chief Complaint  Patient presents with   Annual Exam    Pt is a 29 year old female seen for CPE.  Patient is not fasting.  Patient's status post C-section 09/23/2024 for IUGR, Pree E with severe features and recurrent prolonged decels.  Mother and baby are doing well.  Patient is breast-feeding.  States mood is good.  Has mild cold symptoms.  Taking Mucinex.  Patient inquires about having 2 moles removed as they are becoming larger in size.  1 on posterior neck inferior to hairline at times gets caught in necklace.  Denies bleeding or itching.  Patient also with discoloration of left ear.  Denies pain, itching, irritation/burning.    Patient Active Problem List   Diagnosis Date Noted   Preeclampsia 09/24/2024   Status post primary low transverse cesarean section 09/24/2024   Postpartum care following cesarean delivery 11/2 09/24/2024   IUGR (intrauterine growth restriction) affecting care of mother, third trimester, fetus 1 09/20/2024   Past Medical History:  Diagnosis Date   Headache    Past Surgical History:  Procedure Laterality Date   CESAREAN SECTION N/A 09/23/2024   Procedure: CESAREAN DELIVERY;  Surgeon: Henry Slough, MD;  Location: [facility];  Service: Obstetrics;  Laterality: N/A;   WISDOM TOOTH EXTRACTION     Social History[1] Family History  Problem Relation Age of Onset   Healthy Mother    Kidney failure Father    Breast cancer Paternal Aunt    High blood pressure Maternal Grandmother        Died at 69   Alzheimer's disease Maternal Grandfather        Died at 47   Cancer Paternal Grandfather        Age at time of death unknown   Colon cancer Neg Hx    Esophageal cancer Neg Hx    Stomach cancer Neg Hx    Allergies[2]  ROS Negative unless stated above    Objective:     BP 120/68 (BP Location: Left Arm, Patient  Position: Sitting, Cuff Size: Normal)   Pulse 62   Temp 98.1 F (36.7 C) (Oral)   Ht 5' 4.75 (1.645 m)   Wt 113 lb (51.3 kg)   LMP  (LMP Unknown)   SpO2 100%   Breastfeeding Yes Comment: pumping  baby just came home on Monday  BMI 18.95 kg/m  BP Readings from Last 3 Encounters:  12/07/24 120/68  10/03/24 (!) 142/93  09/25/24 121/72   Wt Readings from Last 3 Encounters:  12/07/24 113 lb (51.3 kg)  10/03/24 114 lb 3.2 oz (51.8 kg)  09/20/24 115 lb (52.2 kg)      Physical Exam Constitutional:      Appearance: Normal appearance.  HENT:     Head: Normocephalic and atraumatic.     Right Ear: Tympanic membrane, ear canal and external ear normal.     Left Ear: Tympanic membrane, ear canal and external ear normal.     Nose: Nose normal.     Mouth/Throat:     Mouth: Mucous membranes are moist.     Pharynx: No oropharyngeal exudate or posterior oropharyngeal erythema.  Eyes:     General: No scleral icterus.    Extraocular Movements: Extraocular movements intact.     Conjunctiva/sclera: Conjunctivae normal.     Pupils: Pupils are equal, round, and reactive  to light.  Neck:     Thyroid : No thyromegaly.     Vascular: No carotid bruit.  Cardiovascular:     Rate and Rhythm: Normal rate and regular rhythm.     Pulses: Normal pulses.     Heart sounds: Normal heart sounds. No murmur heard.    No friction rub.  Pulmonary:     Effort: Pulmonary effort is normal.     Breath sounds: Normal breath sounds. No wheezing, rhonchi or rales.  Abdominal:     General: Bowel sounds are normal.     Palpations: Abdomen is soft.     Tenderness: There is no abdominal tenderness.  Musculoskeletal:        General: No deformity. Normal range of motion.  Lymphadenopathy:     Cervical: No cervical adenopathy.  Skin:    General: Skin is warm and dry.     Findings: No lesion.     Comments: To papular nevi 1.1 cm at nape of neck and left posterior shoulder.  Blanching discoloration along left ear  and posterior auricular area.  Neurological:     General: No focal deficit present.     Mental Status: She is alert and oriented to person, place, and time.  Psychiatric:        Mood and Affect: Mood normal.        Thought Content: Thought content normal.         12/07/2024    2:46 PM 04/13/2023    8:15 AM 11/25/2017    8:43 AM  Depression screen PHQ 2/9  Decreased Interest 0 0 1  Down, Depressed, Hopeless 0 0 1  PHQ - 2 Score 0 0 2  Altered sleeping 0 0 3  Tired, decreased energy 1 0 3  Change in appetite 1 3 3   Feeling bad or failure about yourself  0 1 1  Trouble concentrating 0 0 1  Moving slowly or fidgety/restless 0 0 1  Suicidal thoughts 0 0 0  PHQ-9 Score 2 4  14    Difficult doing work/chores  Not difficult at all      Data saved with a previous flowsheet row definition      12/07/2024    2:46 PM 04/13/2023    8:15 AM  GAD 7 : Generalized Anxiety Score  Nervous, Anxious, on Edge 1 1  Control/stop worrying 1 0  Worry too much - different things 1 1  Trouble relaxing 1 0  Restless 0 1  Easily annoyed or irritable 0 1  Afraid - awful might happen 1 0  Total GAD 7 Score 5 4  Anxiety Difficulty Not difficult at all      No results found for any visits on 12/07/24.    Assessment & Plan:   Well adult exam -     Hemoglobin A1c; Future -     CBC with Differential/Platelet; Future -     TSH; Future -     Comprehensive metabolic panel with GFR; Future  Change in multiple nevi -     Ambulatory referral to Dermatology  Vascular birthmark -     Ambulatory referral to Dermatology   Age-appropriate health screenings discussed.  Obtain labs.  Immunizations reviewed.  Pap up-to-date, done with OB/GYN, due later this year.  Referral to dermatology placed for mole removal and discoloration of left ear increasing in size.  Possibly a birthmark vascular in nature as blanches.  PHQ-9 score 2, GAD-7 score 5.  Continue to monitor as patient is  a new mother for discussed  cough/cold medications safe for breast-feeding.  Return in about 1 year (around 12/07/2025), or if symptoms worsen or fail to improve, for physical.   Elaine JONELLE Single, MD    [1]  Social History Tobacco Use   Smoking status: Never   Smokeless tobacco: Never  Vaping Use   Vaping status: Never Used  Substance Use Topics   Alcohol use: Yes   Drug use: Yes    Types: Marijuana    Comment: every day  [2]  Allergies Allergen Reactions   Cashew Nut Oil Anaphylaxis   Other Anaphylaxis   [place] Nut Extract Anaphylaxis   Anacardium Occidentale Hives   "

## 2024-12-08 LAB — CBC WITH DIFFERENTIAL/PLATELET
Absolute Lymphocytes: 2044 {cells}/uL (ref 850–3900)
Absolute Monocytes: 480 {cells}/uL (ref 200–950)
Basophils Absolute: 12 {cells}/uL (ref 0–200)
Basophils Relative: 0.3 %
Eosinophils Absolute: 160 {cells}/uL (ref 15–500)
Eosinophils Relative: 4 %
HCT: 34.4 % — ABNORMAL LOW (ref 35.9–46.0)
Hemoglobin: 11.5 g/dL — ABNORMAL LOW (ref 11.7–15.5)
MCH: 31.4 pg (ref 27.0–33.0)
MCHC: 33.4 g/dL (ref 31.6–35.4)
MCV: 94 fL (ref 81.4–101.7)
MPV: 9.6 fL (ref 7.5–12.5)
Monocytes Relative: 12 %
Neutro Abs: 1304 {cells}/uL — ABNORMAL LOW (ref 1500–7800)
Neutrophils Relative %: 32.6 %
Platelets: 356 Thousand/uL (ref 140–400)
RBC: 3.66 Million/uL — ABNORMAL LOW (ref 3.80–5.10)
RDW: 12.6 % (ref 11.0–15.0)
Total Lymphocyte: 51.1 %
WBC: 4 Thousand/uL (ref 3.8–10.8)

## 2024-12-08 LAB — COMPREHENSIVE METABOLIC PANEL WITH GFR
AG Ratio: 1.7 (calc) (ref 1.0–2.5)
ALT: 10 U/L (ref 6–29)
AST: 20 U/L (ref 10–30)
Albumin: 4.7 g/dL (ref 3.6–5.1)
Alkaline phosphatase (APISO): 49 U/L (ref 31–125)
BUN/Creatinine Ratio: 7 (calc) (ref 6–22)
BUN: 6 mg/dL — ABNORMAL LOW (ref 7–25)
CO2: 25 mmol/L (ref 20–32)
Calcium: 9 mg/dL (ref 8.6–10.2)
Chloride: 106 mmol/L (ref 98–110)
Creat: 0.85 mg/dL (ref 0.50–0.96)
Globulin: 2.7 g/dL (ref 1.9–3.7)
Glucose, Bld: 74 mg/dL (ref 65–99)
Potassium: 4 mmol/L (ref 3.5–5.3)
Sodium: 139 mmol/L (ref 135–146)
Total Bilirubin: 0.3 mg/dL (ref 0.2–1.2)
Total Protein: 7.4 g/dL (ref 6.1–8.1)
eGFR: 97 mL/min/1.73m2

## 2024-12-08 LAB — TSH: TSH: 1.42 m[IU]/L

## 2024-12-08 LAB — HEMOGLOBIN A1C
Hgb A1c MFr Bld: 5 %
Mean Plasma Glucose: 97 mg/dL
eAG (mmol/L): 5.4 mmol/L

## 2024-12-14 ENCOUNTER — Ambulatory Visit: Payer: Self-pay | Admitting: Family Medicine
